# Patient Record
Sex: Female | Born: 1970 | Race: Black or African American | Hispanic: No | Marital: Married | Smoking: Never smoker
Health system: Southern US, Community
[De-identification: ages and names within clinical notes are randomized; demographics above are authoritative.]

## PROBLEM LIST (undated history)

## (undated) DIAGNOSIS — E785 Hyperlipidemia, unspecified: Secondary | ICD-10-CM

## (undated) HISTORY — DX: Hyperlipidemia, unspecified: E78.5

## (undated) HISTORY — PX: BACK SURGERY: SHX140

## (undated) HISTORY — PX: AUGMENTATION MAMMAPLASTY: SUR837

---

## 2014-05-06 ENCOUNTER — Other Ambulatory Visit (HOSPITAL_COMMUNITY)
Admission: RE | Admit: 2014-05-06 | Discharge: 2014-05-06 | Disposition: A | Discharge status: Home / Self Care | Payer: Managed Care, Other (non HMO) | Source: Ambulatory Visit | Attending: Obstetrics and Gynecology | Admitting: Obstetrics and Gynecology

## 2014-05-06 DIAGNOSIS — Z01419 Encounter for gynecological examination (general) (routine) without abnormal findings: Secondary | ICD-10-CM | POA: Insufficient documentation

## 2014-05-06 DIAGNOSIS — Z1151 Encounter for screening for human papillomavirus (HPV): Secondary | ICD-10-CM | POA: Diagnosis present

## 2014-05-29 ENCOUNTER — Other Ambulatory Visit: Payer: Self-pay

## 2014-05-29 DIAGNOSIS — Z1231 Encounter for screening mammogram for malignant neoplasm of breast: Secondary | ICD-10-CM

## 2014-06-12 ENCOUNTER — Ambulatory Visit
Admission: RE | Admit: 2014-06-12 | Discharge: 2014-06-12 | Disposition: A | Discharge status: Home / Self Care | Payer: Commercial Indemnity | Source: Ambulatory Visit

## 2014-06-12 DIAGNOSIS — Z1231 Encounter for screening mammogram for malignant neoplasm of breast: Secondary | ICD-10-CM

## 2014-08-19 DIAGNOSIS — K9041 Non-celiac gluten sensitivity: Secondary | ICD-10-CM | POA: Insufficient documentation

## 2015-07-02 ENCOUNTER — Ambulatory Visit: Payer: Commercial Indemnity | Admitting: Psychology

## 2015-07-26 ENCOUNTER — Encounter: Payer: Self-pay | Admitting: Obstetrics & Gynecology

## 2015-07-26 ENCOUNTER — Ambulatory Visit (INDEPENDENT_AMBULATORY_CARE_PROVIDER_SITE_OTHER): Payer: Managed Care, Other (non HMO) | Admitting: Obstetrics & Gynecology

## 2015-07-26 VITALS — BP 107/73 | HR 73 | Ht 64.0 in | Wt 150.0 lb

## 2015-07-26 DIAGNOSIS — Z3041 Encounter for surveillance of contraceptive pills: Secondary | ICD-10-CM

## 2015-07-26 DIAGNOSIS — Z113 Encounter for screening for infections with a predominantly sexual mode of transmission: Secondary | ICD-10-CM

## 2015-07-26 DIAGNOSIS — S31809A Unspecified open wound of unspecified buttock, initial encounter: Secondary | ICD-10-CM | POA: Diagnosis not present

## 2015-07-26 DIAGNOSIS — Z1151 Encounter for screening for human papillomavirus (HPV): Secondary | ICD-10-CM | POA: Diagnosis not present

## 2015-07-26 DIAGNOSIS — Z1231 Encounter for screening mammogram for malignant neoplasm of breast: Secondary | ICD-10-CM

## 2015-07-26 DIAGNOSIS — Z124 Encounter for screening for malignant neoplasm of cervix: Secondary | ICD-10-CM

## 2015-07-26 DIAGNOSIS — N946 Dysmenorrhea, unspecified: Secondary | ICD-10-CM | POA: Diagnosis not present

## 2015-07-26 DIAGNOSIS — Z01419 Encounter for gynecological examination (general) (routine) without abnormal findings: Secondary | ICD-10-CM

## 2015-07-26 MED ORDER — TRIAMCINOLONE ACETONIDE 0.5 % EX OINT: 1.0000 "application " | TOPICAL_OINTMENT | Freq: Two times a day (BID) | CUTANEOUS | Status: DC

## 2015-07-26 MED ORDER — NORGESTIMATE-ETH ESTRADIOL 0.25-35 MG-MCG PO TABS: 1.0000 | ORAL_TABLET | Freq: Every day | ORAL | Status: DC

## 2015-07-26 MED ORDER — NAPROXEN 500 MG PO TABS: 500.0000 mg | ORAL_TABLET | Freq: Two times a day (BID) | ORAL | Status: DC

## 2015-07-26 NOTE — Progress Notes (Signed)
GYNECOLOGY CLINIC ANNUAL PREVENTATIVE CARE ENCOUNTER NOTE  Subjective:   Regina Schaefer is a 45 y.o. [phone] female here for a routine annual gynecologic exam and to establish care.  Current complaints: patient has dysmenorrhea for first two days of period despite being on OCPs, wants to know what she can take for that.  Also reports having occasional skin tear in gluteal cleft, was prescribed topical medication in past that helps with this but she is unsure of its name.   Denies abnormal vaginal bleeding, discharge, other pelvic pain, problems with intercourse or other gynecologic concerns.    Gynecologic History Patient's last menstrual period was 07/19/2015. Contraception: OCP (estrogen/progesterone) - Sprintec; desires refill Last Pap: 2016. Results were: normal Last mammogram: 2016. Results were: normal  Obstetric History OB History  Gravida Para Term Preterm AB SAB TAB Ectopic Multiple Living  3 2   1 1    2     # Outcome Date GA Lbr Len/2nd Weight Sex Delivery Anes PTL Lv  3 SAB           2 Para      Vag-Spont     1 Para      CS-Unspec         Past Medical History  Diagnosis Date  . Hyperlipidemia     Past Surgical History  Procedure Laterality Date  . Cesarean section    . Back surgery      No current outpatient prescriptions on file prior to visit.   No current facility-administered medications on file prior to visit.    Allergies  Allergen Reactions  . Gluten Meal   . Orphenadrine     Other reaction(s): Confusion  . Shellfish Allergy Nausea Only    Social History   Social History  . Marital Status: Married    Spouse Name: N/A  . Number of Children: N/A  . Years of Education: N/A   Occupational History  . Not on file.   Social History Main Topics  . Smoking status: Never Smoker   . Smokeless tobacco: Not on file  . Alcohol Use: No  . Drug Use: No  . Sexual Activity: Yes    Birth Control/ Protection: Pill, Surgical   Other Topics Concern   . Not on file   Social History Narrative  . No narrative on file    Family History  Problem Relation Age of Onset  . Cancer Father     leukemia  . Diabetes Father   . Hypertension Father   . Stroke Neg Hx     The following portions of the patient's history were reviewed and updated as appropriate: allergies, current medications, past family history, past medical history, past social history, past surgical history and problem list.  Review of Systems Pertinent items noted in HPI and remainder of comprehensive ROS otherwise negative.   Objective:  BP 107/73 mmHg  Pulse 73  Ht 5\' 4"  (1.626 m)  Wt 150 lb (68.04 kg)  BMI 25.73 kg/m2  LMP 07/19/2015 CONSTITUTIONAL: Well-developed, well-nourished female in no acute distress.  HENT:  Normocephalic, atraumatic, External right and left ear normal. Oropharynx is clear and moist EYES: Conjunctivae and EOM are normal. Pupils are equal, round, and reactive to light. No scleral icterus.  NECK: Normal range of motion, supple, no masses.  Normal thyroid.  SKIN: Skin is warm and dry. No rash noted. Not diaphoretic. No erythema. No pallor. NEUROLOGIC: Alert and oriented to person, place, and time. Normal reflexes, muscle tone coordination.  No cranial nerve deficit noted. PSYCHIATRIC: Normal mood and affect. Normal behavior. Normal judgment and thought content. CARDIOVASCULAR: Normal heart rate noted, regular rhythm RESPIRATORY: Clear to auscultation bilaterally. Effort and breath sounds normal, no problems with respiration noted. BREASTS: Symmetric in size. No masses, skin changes, nipple drainage, or lymphadenopathy. ABDOMEN: Soft, normal bowel sounds, no distention noted.  No tenderness, rebound or guarding.  PELVIC: Normal appearing external genitalia; normal appearing vaginal mucosa and cervix.  Small amount of bloody discharge noted.  Pap smear obtained.  Normal uterine size, no other palpable masses, no uterine or adnexal tenderness. No  gluteal cleft skin tear appreciated today MUSCULOSKELETAL: Normal range of motion. No tenderness.  No cyanosis, clubbing, or edema.  2+ distal pulses.   Assessment and Plan  1. Encounter for gynecological examination with Papanicolaou smear of cervix - Cytology - PAP done with ancillary testing as per patient request. Will follow up results and manage accordingly.   2. Uses oral contraception Refills ordered for patient. - norgestimate-ethinyl estradiol (ORTHO-CYCLEN,SPRINTEC,PREVIFEM) 0.25-35 MG-MCG tablet; Take 1 tablet by mouth daily.  Dispense: 3 Package; Refill: 5  3. Dysmenorrhea - naproxen (NAPROSYN) 500 MG tablet; Take 1 tablet (500 mg total) by mouth 2 (two) times daily with a meal. As needed for pain  Dispense: 60 tablet; Refill: 2 If pain persists, may consider taking OCPs in a continuous fashion.  4. Visit for screening mammogram - MM DIGITAL SCREENING BILATERAL; Future ordered  5. Gluteal cleft wound, unspecified laterality, initial encounter She likely was prescribed topical steroid ointment to use as needed.  She was told to call back if this does not help. - triamcinolone ointment (KENALOG) 0.5 %; Apply 1 application topically 2 (two) times daily.  Dispense: 30 g; Refill: 0  Routine preventative health maintenance measures emphasized. Please refer to After Visit Summary for other counseling recommendations.    Jaynie Collins, MD, FACOG Attending Obstetrician & Gynecologist, [facility]

## 2015-07-26 NOTE — Patient Instructions (Signed)
Thank you for enrolling in [facility]. Please follow the instructions below to securely access your online medical record. MyChart allows you to send messages to your doctor, view your test results, manage appointments, and more.   How Do I Sign Up? 1. In your Internet browser, go to [employer] and enter [email or web address]. 2. Click on the Sign Up Now link in the Sign In box. You will see the New Member Sign Up page. 3. Enter your MyChart Access Code exactly as it appears below. You will not need to use this code after you've completed the sign-up process. If you do not sign up before the expiration date, you must request a new code.  MyChart Access Code: Activation code not generated Current MyChart Status: Active  4. Enter your Social Security Number ([id number]) and Date of Birth (mm/dd/yyyy) as indicated and click Submit. You will be taken to the next sign-up page. 5. Create a MyChart ID. This will be your MyChart login ID and cannot be changed, so think of one that is secure and easy to remember. 6. Create a MyChart password. You can change your password at any time. 7. Enter your Password Reset Question and Answer. This can be used at a later time if you forget your password.  8. Enter your e-mail address. You will receive e-mail notification when new information is available in [facility]. 9. Click Sign Up. You can now view your medical record.   Additional Information Remember, MyChart is NOT to be used for urgent needs. For medical emergencies, dial 911.   Preventive Care for Adults, Female A healthy lifestyle and preventive care can promote health and wellness. Preventive health guidelines for women include the following key practices.  A routine yearly physical is a good way to check with your health care provider about your health and preventive screening. It is a chance to share any concerns and updates on your health and to receive a thorough exam.  Visit your  dentist for a routine exam and preventive care every 6 months. Brush your teeth twice a day and floss once a day. Good oral hygiene prevents tooth decay and gum disease.  The frequency of eye exams is based on your age, health, family medical history, use of contact lenses, and other factors. Follow your health care provider's recommendations for frequency of eye exams.  Eat a healthy diet. Foods like vegetables, fruits, whole grains, low-fat dairy products, and lean protein foods contain the nutrients you need without too many calories. Decrease your intake of foods high in solid fats, added sugars, and salt. Eat the right amount of calories for you.Get information about a proper diet from your health care provider, if necessary.  Regular physical exercise is one of the most important things you can do for your health. Most adults should get at least 150 minutes of moderate-intensity exercise (any activity that increases your heart rate and causes you to sweat) each week. In addition, most adults need muscle-strengthening exercises on 2 or more days a week.  Maintain a healthy weight. The body mass index (BMI) is a screening tool to identify possible weight problems. It provides an estimate of body fat based on height and weight. Your health care provider can find your BMI and can help you achieve or maintain a healthy weight.For adults 20 years and older:  A BMI below 18.5 is considered underweight.  A BMI of 18.5 to 24.9 is normal.  A BMI of 25 to 29.9 is considered overweight.  A BMI of 30 and above is considered obese.  Maintain normal blood lipids and cholesterol levels by exercising and minimizing your intake of saturated fat. Eat a balanced diet with plenty of fruit and vegetables. Blood tests for lipids and cholesterol should begin at age 3 and be repeated every 5 years. If your lipid or cholesterol levels are high, you are over 50, or you are at high risk for heart disease, you may  need your cholesterol levels checked more frequently.Ongoing high lipid and cholesterol levels should be treated with medicines if diet and exercise are not working.  If you smoke, find out from your health care provider how to quit. If you do not use tobacco, do not start.  Lung cancer screening is recommended for adults aged 14-80 years who are at high risk for developing lung cancer because of a history of smoking. A yearly low-dose CT scan of the lungs is recommended for people who have at least a 30-pack-year history of smoking and are a current smoker or have quit within the past 15 years. A pack year of smoking is smoking an average of 1 pack of cigarettes a day for 1 year (for example: 1 pack a day for 30 years or 2 packs a day for 15 years). Yearly screening should continue until the smoker has stopped smoking for at least 15 years. Yearly screening should be stopped for people who develop a health problem that would prevent them from having lung cancer treatment.  If you are pregnant, do not drink alcohol. If you are breastfeeding, be very cautious about drinking alcohol. If you are not pregnant and choose to drink alcohol, do not have more than 1 drink per day. One drink is considered to be 12 ounces (355 mL) of beer, 5 ounces (148 mL) of wine, or 1.5 ounces (44 mL) of liquor.  Avoid use of street drugs. Do not share needles with anyone. Ask for help if you need support or instructions about stopping the use of drugs.  High blood pressure causes heart disease and increases the risk of stroke. Your blood pressure should be checked at least every 1 to 2 years. Ongoing high blood pressure should be treated with medicines if weight loss and exercise do not work.  If you are 63-71 years old, ask your health care provider if you should take aspirin to prevent strokes.  Diabetes screening is done by taking a blood sample to check your blood glucose level after you have not eaten for a certain  period of time (fasting). If you are not overweight and you do not have risk factors for diabetes, you should be screened once every 3 years starting at age 54. If you are overweight or obese and you are 48-74 years of age, you should be screened for diabetes every year as part of your cardiovascular risk assessment.  Breast cancer screening is essential preventive care for women. You should practice "breast self-awareness." This means understanding the normal appearance and feel of your breasts and may include breast self-examination. Any changes detected, no matter how small, should be reported to a health care provider. Women in their 69s and 30s should have a clinical breast exam (CBE) by a health care provider as part of a regular health exam every 1 to 3 years. After age 40, women should have a CBE every year. Starting at age 75, women should consider having a mammogram (breast X-ray test) every year. Women who have a family history of  breast cancer should talk to their health care provider about genetic screening. Women at a high risk of breast cancer should talk to their health care providers about having an MRI and a mammogram every year.  Breast cancer gene (BRCA)-related cancer risk assessment is recommended for women who have family members with BRCA-related cancers. BRCA-related cancers include breast, ovarian, tubal, and peritoneal cancers. Having family members with these cancers may be associated with an increased risk for harmful changes (mutations) in the breast cancer genes BRCA1 and BRCA2. Results of the assessment will determine the need for genetic counseling and BRCA1 and BRCA2 testing.  Your health care provider may recommend that you be screened regularly for cancer of the pelvic organs (ovaries, uterus, and vagina). This screening involves a pelvic examination, including checking for microscopic changes to the surface of your cervix (Pap test). You may be encouraged to have this  screening done every 3 years, beginning at age 31.  For women ages 3-65, health care providers may recommend pelvic exams and Pap testing every 3 years, or they may recommend the Pap and pelvic exam, combined with testing for human papilloma virus (HPV), every 5 years. Some types of HPV increase your risk of cervical cancer. Testing for HPV may also be done on women of any age with unclear Pap test results.  Other health care providers may not recommend any screening for nonpregnant women who are considered low risk for pelvic cancer and who do not have symptoms. Ask your health care provider if a screening pelvic exam is right for you.  If you have had past treatment for cervical cancer or a condition that could lead to cancer, you need Pap tests and screening for cancer for at least 20 years after your treatment. If Pap tests have been discontinued, your risk factors (such as having a new sexual partner) need to be reassessed to determine if screening should resume. Some women have medical problems that increase the chance of getting cervical cancer. In these cases, your health care provider may recommend more frequent screening and Pap tests.  Colorectal cancer can be detected and often prevented. Most routine colorectal cancer screening begins at the age of 13 years and continues through age 42 years. However, your health care provider may recommend screening at an earlier age if you have risk factors for colon cancer. On a yearly basis, your health care provider may provide home test kits to check for hidden blood in the stool. Use of a small camera at the end of a tube, to directly examine the colon (sigmoidoscopy or colonoscopy), can detect the earliest forms of colorectal cancer. Talk to your health care provider about this at age 89, when routine screening begins. Direct exam of the colon should be repeated every 5-10 years through age 44 years, unless early forms of precancerous polyps or small  growths are found.  People who are at an increased risk for hepatitis B should be screened for this virus. You are considered at high risk for hepatitis B if:  You were born in a country where hepatitis B occurs often. Talk with your health care provider about which countries are considered high risk.  Your parents were born in a high-risk country and you have not received a shot to protect against hepatitis B (hepatitis B vaccine).  You have HIV or AIDS.  You use needles to inject street drugs.  You live with, or have sex with, someone who has hepatitis B.  You get  hemodialysis treatment.  You take certain medicines for conditions like cancer, organ transplantation, and autoimmune conditions.  Hepatitis C blood testing is recommended for all people born from [birth year] through 1965 and any individual with known risks for hepatitis C.  Practice safe sex. Use condoms and avoid high-risk sexual practices to reduce the spread of sexually transmitted infections (STIs). STIs include gonorrhea, chlamydia, syphilis, trichomonas, herpes, HPV, and human immunodeficiency virus (HIV). Herpes, HIV, and HPV are viral illnesses that have no cure. They can result in disability, cancer, and death.  You should be screened for sexually transmitted illnesses (STIs) including gonorrhea and chlamydia if:  You are sexually active and are younger than 24 years.  You are older than 24 years and your health care provider tells you that you are at risk for this type of infection.  Your sexual activity has changed since you were last screened and you are at an increased risk for chlamydia or gonorrhea. Ask your health care provider if you are at risk.  If you are at risk of being infected with HIV, it is recommended that you take a prescription medicine daily to prevent HIV infection. This is called preexposure prophylaxis (PrEP). You are considered at risk if:  You are sexually active and do not regularly use  condoms or know the HIV status of your partner(s).  You take drugs by injection.  You are sexually active with a partner who has HIV.  Talk with your health care provider about whether you are at high risk of being infected with HIV. If you choose to begin PrEP, you should first be tested for HIV. You should then be tested every 3 months for as long as you are taking PrEP.  Osteoporosis is a disease in which the bones lose minerals and strength with aging. This can result in serious bone fractures or breaks. The risk of osteoporosis can be identified using a bone density scan. Women ages 100 years and over and women at risk for fractures or osteoporosis should discuss screening with their health care providers. Ask your health care provider whether you should take a calcium supplement or vitamin D to reduce the rate of osteoporosis.  Menopause can be associated with physical symptoms and risks. Hormone replacement therapy is available to decrease symptoms and risks. You should talk to your health care provider about whether hormone replacement therapy is right for you.  Use sunscreen. Apply sunscreen liberally and repeatedly throughout the day. You should seek shade when your shadow is shorter than you. Protect yourself by wearing long sleeves, pants, a wide-brimmed hat, and sunglasses year round, whenever you are outdoors.  Once a month, do a whole body skin exam, using a mirror to look at the skin on your back. Tell your health care provider of new moles, moles that have irregular borders, moles that are larger than a pencil eraser, or moles that have changed in shape or color.  Stay current with required vaccines (immunizations).  Influenza vaccine. All adults should be immunized every year.  Tetanus, diphtheria, and acellular pertussis (Td, Tdap) vaccine. Pregnant women should receive 1 dose of Tdap vaccine during each pregnancy. The dose should be obtained regardless of the length of time  since the last dose. Immunization is preferred during the 27th-36th week of gestation. An adult who has not previously received Tdap or who does not know her vaccine status should receive 1 dose of Tdap. This initial dose should be followed by tetanus and diphtheria toxoids (Td)  booster doses every 10 years. Adults with an unknown or incomplete history of completing a 3-dose immunization series with Td-containing vaccines should begin or complete a primary immunization series including a Tdap dose. Adults should receive a Td booster every 10 years.  Varicella vaccine. An adult without evidence of immunity to varicella should receive 2 doses or a second dose if she has previously received 1 dose. Pregnant females who do not have evidence of immunity should receive the first dose after pregnancy. This first dose should be obtained before leaving the health care facility. The second dose should be obtained 4-8 weeks after the first dose.  Human papillomavirus (HPV) vaccine. Females aged 13-26 years who have not received the vaccine previously should obtain the 3-dose series. The vaccine is not recommended for use in pregnant females. However, pregnancy testing is not needed before receiving a dose. If a female is found to be pregnant after receiving a dose, no treatment is needed. In that case, the remaining doses should be delayed until after the pregnancy. Immunization is recommended for any person with an immunocompromised condition through the age of 100 years if she did not get any or all doses earlier. During the 3-dose series, the second dose should be obtained 4-8 weeks after the first dose. The third dose should be obtained 24 weeks after the first dose and 16 weeks after the second dose.  Zoster vaccine. One dose is recommended for adults aged 9 years or older unless certain conditions are present.  Measles, mumps, and rubella (MMR) vaccine. Adults born before 60 generally are considered immune to  measles and mumps. Adults born in [birth year] or later should have 1 or more doses of MMR vaccine unless there is a contraindication to the vaccine or there is laboratory evidence of immunity to each of the three diseases. A routine second dose of MMR vaccine should be obtained at least 28 days after the first dose for students attending postsecondary schools, health care workers, or international travelers. People who received inactivated measles vaccine or an unknown type of measles vaccine during 1963-1967 should receive 2 doses of MMR vaccine. People who received inactivated mumps vaccine or an unknown type of mumps vaccine before 1979 and are at high risk for mumps infection should consider immunization with 2 doses of MMR vaccine. For females of childbearing age, rubella immunity should be determined. If there is no evidence of immunity, females who are not pregnant should be vaccinated. If there is no evidence of immunity, females who are pregnant should delay immunization until after pregnancy. Unvaccinated health care workers born before [birth year] who lack laboratory evidence of measles, mumps, or rubella immunity or laboratory confirmation of disease should consider measles and mumps immunization with 2 doses of MMR vaccine or rubella immunization with 1 dose of MMR vaccine.  Pneumococcal 13-valent conjugate (PCV13) vaccine. When indicated, a person who is uncertain of his immunization history and has no record of immunization should receive the PCV13 vaccine. All adults 8 years of age and older should receive this vaccine. An adult aged 42 years or older who has certain medical conditions and has not been previously immunized should receive 1 dose of PCV13 vaccine. This PCV13 should be followed with a dose of pneumococcal polysaccharide (PPSV23) vaccine. Adults who are at high risk for pneumococcal disease should obtain the PPSV23 vaccine at least 8 weeks after the dose of PCV13 vaccine. Adults older than 45 years  of age who have normal immune system function should obtain  the PPSV23 vaccine dose at least 1 year after the dose of PCV13 vaccine.  Pneumococcal polysaccharide (PPSV23) vaccine. When PCV13 is also indicated, PCV13 should be obtained first. All adults aged 18 years and older should be immunized. An adult younger than age 26 years who has certain medical conditions should be immunized. Any person who resides in a nursing home or long-term care facility should be immunized. An adult smoker should be immunized. People with an immunocompromised condition and certain other conditions should receive both PCV13 and PPSV23 vaccines. People with human immunodeficiency virus (HIV) infection should be immunized as soon as possible after diagnosis. Immunization during chemotherapy or radiation therapy should be avoided. Routine use of PPSV23 vaccine is not recommended for American Indians, [place] Natives, or people younger than 65 years unless there are medical conditions that require PPSV23 vaccine. When indicated, people who have unknown immunization and have no record of immunization should receive PPSV23 vaccine. One-time revaccination 5 years after the first dose of PPSV23 is recommended for people aged 19-64 years who have chronic kidney failure, nephrotic syndrome, asplenia, or immunocompromised conditions. People who received 1-2 doses of PPSV23 before age 11 years should receive another dose of PPSV23 vaccine at age 80 years or later if at least 5 years have passed since the previous dose. Doses of PPSV23 are not needed for people immunized with PPSV23 at or after age 75 years.  Meningococcal vaccine. Adults with asplenia or persistent complement component deficiencies should receive 2 doses of quadrivalent meningococcal conjugate (MenACWY-D) vaccine. The doses should be obtained at least 2 months apart. Microbiologists working with certain meningococcal bacteria, [employer] recruits, people at risk during an  outbreak, and people who travel to or live in countries with a high rate of meningitis should be immunized. A first-year college student up through age 42 years who is living in a residence hall should receive a dose if she did not receive a dose on or after her 16th birthday. Adults who have certain high-risk conditions should receive one or more doses of vaccine.  Hepatitis A vaccine. Adults who wish to be protected from this disease, have certain high-risk conditions, work with hepatitis A-infected animals, work in hepatitis A research labs, or travel to or work in countries with a high rate of hepatitis A should be immunized. Adults who were previously unvaccinated and who anticipate close contact with an international adoptee during the first 60 days after arrival in the [place] from a country with a high rate of hepatitis A should be immunized.  Hepatitis B vaccine. Adults who wish to be protected from this disease, have certain high-risk conditions, may be exposed to blood or other infectious body fluids, are household contacts or sex partners of hepatitis B positive people, are clients or workers in certain care facilities, or travel to or work in countries with a high rate of hepatitis B should be immunized.  Haemophilus influenzae type b (Hib) vaccine. A previously unvaccinated person with asplenia or sickle cell disease or having a scheduled splenectomy should receive 1 dose of Hib vaccine. Regardless of previous immunization, a recipient of a hematopoietic stem cell transplant should receive a 3-dose series 6-12 months after her successful transplant. Hib vaccine is not recommended for adults with HIV infection. Preventive Services / Frequency Ages 65 to 36 years  Blood pressure check.** / Every 3-5 years.  Lipid and cholesterol check.** / Every 5 years beginning at age 69.  Clinical breast exam.** / Every 3 years for women  in their 51s and 30s.  BRCA-related cancer risk  assessment.** / For women who have family members with a BRCA-related cancer (breast, ovarian, tubal, or peritoneal cancers).  Pap test.** / Every 2 years from ages 75 through 110. Every 3 years starting at age 36 through age 56 or 22 with a history of 3 consecutive normal Pap tests.  HPV screening.** / Every 3 years from ages 85 through ages 14 to 55 with a history of 3 consecutive normal Pap tests.  Hepatitis C blood test.** / For any individual with known risks for hepatitis C.  Skin self-exam. / Monthly.  Influenza vaccine. / Every year.  Tetanus, diphtheria, and acellular pertussis (Tdap, Td) vaccine.** / Consult your health care provider. Pregnant women should receive 1 dose of Tdap vaccine during each pregnancy. 1 dose of Td every 10 years.  Varicella vaccine.** / Consult your health care provider. Pregnant females who do not have evidence of immunity should receive the first dose after pregnancy.  HPV vaccine. / 3 doses over 6 months, if 60 and younger. The vaccine is not recommended for use in pregnant females. However, pregnancy testing is not needed before receiving a dose.  Measles, mumps, rubella (MMR) vaccine.** / You need at least 1 dose of MMR if you were born in 1957 or later. You may also need a 2nd dose. For females of childbearing age, rubella immunity should be determined. If there is no evidence of immunity, females who are not pregnant should be vaccinated. If there is no evidence of immunity, females who are pregnant should delay immunization until after pregnancy.  Pneumococcal 13-valent conjugate (PCV13) vaccine.** / Consult your health care provider.  Pneumococcal polysaccharide (PPSV23) vaccine.** / 1 to 2 doses if you smoke cigarettes or if you have certain conditions.  Meningococcal vaccine.** / 1 dose if you are age 78 to 15 years and a Market researcher living in a residence hall, or have one of several medical conditions, you need to get vaccinated  against meningococcal disease. You may also need additional booster doses.  Hepatitis A vaccine.** / Consult your health care provider.  Hepatitis B vaccine.** / Consult your health care provider.  Haemophilus influenzae type b (Hib) vaccine.** / Consult your health care provider. Ages 74 to 41 years  Blood pressure check.** / Every year.  Lipid and cholesterol check.** / Every 5 years beginning at age 47 years.  Lung cancer screening. / Every year if you are aged 16-80 years and have a 30-pack-year history of smoking and currently smoke or have quit within the past 15 years. Yearly screening is stopped once you have quit smoking for at least 15 years or develop a health problem that would prevent you from having lung cancer treatment.  Clinical breast exam.** / Every year after age 11 years.  BRCA-related cancer risk assessment.** / For women who have family members with a BRCA-related cancer (breast, ovarian, tubal, or peritoneal cancers).  Mammogram.** / Every year beginning at age 78 years and continuing for as long as you are in good health. Consult with your health care provider.  Pap test.** / Every 3 years starting at age 55 years through age 30 or 22 years with a history of 3 consecutive normal Pap tests.  HPV screening.** / Every 3 years from ages 58 years through ages 63 to 47 years with a history of 3 consecutive normal Pap tests.  Fecal occult blood test (FOBT) of stool. / Every year beginning at age 73  years and continuing until age 62 years. You may not need to do this test if you get a colonoscopy every 10 years.  Flexible sigmoidoscopy or colonoscopy.** / Every 5 years for a flexible sigmoidoscopy or every 10 years for a colonoscopy beginning at age 8 years and continuing until age 27 years.  Hepatitis C blood test.** / For all people born from [birth year] through 1965 and any individual with known risks for hepatitis C.  Skin self-exam. / Monthly.  Influenza vaccine. /  Every year.  Tetanus, diphtheria, and acellular pertussis (Tdap/Td) vaccine.** / Consult your health care provider. Pregnant women should receive 1 dose of Tdap vaccine during each pregnancy. 1 dose of Td every 10 years.  Varicella vaccine.** / Consult your health care provider. Pregnant females who do not have evidence of immunity should receive the first dose after pregnancy.  Zoster vaccine.** / 1 dose for adults aged 40 years or older.  Measles, mumps, rubella (MMR) vaccine.** / You need at least 1 dose of MMR if you were born in 1957 or later. You may also need a second dose. For females of childbearing age, rubella immunity should be determined. If there is no evidence of immunity, females who are not pregnant should be vaccinated. If there is no evidence of immunity, females who are pregnant should delay immunization until after pregnancy.  Pneumococcal 13-valent conjugate (PCV13) vaccine.** / Consult your health care provider.  Pneumococcal polysaccharide (PPSV23) vaccine.** / 1 to 2 doses if you smoke cigarettes or if you have certain conditions.  Meningococcal vaccine.** / Consult your health care provider.  Hepatitis A vaccine.** / Consult your health care provider.  Hepatitis B vaccine.** / Consult your health care provider.  Haemophilus influenzae type b (Hib) vaccine.** / Consult your health care provider. Ages 73 years and over  Blood pressure check.** / Every year.  Lipid and cholesterol check.** / Every 5 years beginning at age 76 years.  Lung cancer screening. / Every year if you are aged 71-80 years and have a 30-pack-year history of smoking and currently smoke or have quit within the past 15 years. Yearly screening is stopped once you have quit smoking for at least 15 years or develop a health problem that would prevent you from having lung cancer treatment.  Clinical breast exam.** / Every year after age 69 years.  BRCA-related cancer risk assessment.** / For  women who have family members with a BRCA-related cancer (breast, ovarian, tubal, or peritoneal cancers).  Mammogram.** / Every year beginning at age 59 years and continuing for as long as you are in good health. Consult with your health care provider.  Pap test.** / Every 3 years starting at age 57 years through age 29 or 78 years with 3 consecutive normal Pap tests. Testing can be stopped between 65 and 70 years with 3 consecutive normal Pap tests and no abnormal Pap or HPV tests in the past 10 years.  HPV screening.** / Every 3 years from ages 74 years through ages 64 or 29 years with a history of 3 consecutive normal Pap tests. Testing can be stopped between 65 and 70 years with 3 consecutive normal Pap tests and no abnormal Pap or HPV tests in the past 10 years.  Fecal occult blood test (FOBT) of stool. / Every year beginning at age 76 years and continuing until age 64 years. You may not need to do this test if you get a colonoscopy every 10 years.  Flexible sigmoidoscopy or colonoscopy.** /  Every 5 years for a flexible sigmoidoscopy or every 10 years for a colonoscopy beginning at age 62 years and continuing until age 16 years.  Hepatitis C blood test.** / For all people born from [birth year] through 1965 and any individual with known risks for hepatitis C.  Osteoporosis screening.** / A one-time screening for women ages 84 years and over and women at risk for fractures or osteoporosis.  Skin self-exam. / Monthly.  Influenza vaccine. / Every year.  Tetanus, diphtheria, and acellular pertussis (Tdap/Td) vaccine.** / 1 dose of Td every 10 years.  Varicella vaccine.** / Consult your health care provider.  Zoster vaccine.** / 1 dose for adults aged 62 years or older.  Pneumococcal 13-valent conjugate (PCV13) vaccine.** / Consult your health care provider.  Pneumococcal polysaccharide (PPSV23) vaccine.** / 1 dose for all adults aged 30 years and older.  Meningococcal vaccine.** / Consult your  health care provider.  Hepatitis A vaccine.** / Consult your health care provider.  Hepatitis B vaccine.** / Consult your health care provider.  Haemophilus influenzae type b (Hib) vaccine.** / Consult your health care provider. ** Family history and personal history of risk and conditions may change your health care provider's recommendations.   This information is not intended to replace advice given to you by your health care provider. Make sure you discuss any questions you have with your health care provider.   Document Released: 03/21/2001 Document Revised: 02/13/2014 Document Reviewed: 06/20/2010 Elsevier Interactive Patient Education [employer].

## 2015-07-27 ENCOUNTER — Telehealth: Payer: Self-pay

## 2015-07-27 ENCOUNTER — Other Ambulatory Visit: Payer: Self-pay | Admitting: Obstetrics & Gynecology

## 2015-07-27 LAB — CYTOLOGY - PAP

## 2015-07-27 NOTE — Telephone Encounter (Signed)
Pt called [facility] office stating [facility] never received medication request. Called [facility] and pharmacy stated due to power outage their system was offline. I reordered the medicine for the patient and pt is aware it was called in.

## 2015-07-28 ENCOUNTER — Telehealth: Payer: Self-pay

## 2015-07-28 NOTE — Telephone Encounter (Signed)
Patient called and states that [facility] doesn't have her prescriptions. Called pharmacy and tech states that there is a note that patient talked with pharmacy supervisor and Rxs were closed out.   Called patient to inform her of this and she denies called to reject Rxs. Patient requests

## 2015-07-28 NOTE — Telephone Encounter (Signed)
[facility] Called all three scripts in (Orthotricyclen, naprosyen, and triamcilone cream). When calling pharmacy back they said that a provider closed the scripts out yesterday because they had them listed under a Dr. Richardson Doppole. Informed pharmacy that we dont have a Dr. Richardson Doppole in our group and gave her prescriber Dr. Macon LargeAnyanwu and the prescribers NPI number. Made pharmacy aware that patient would be in to pick up scripts in one hour. Armandina StammerJennifer Kaydon Husby RNBSN

## 2015-08-23 ENCOUNTER — Encounter: Payer: Self-pay | Admitting: Obstetrics & Gynecology

## 2015-08-23 ENCOUNTER — Ambulatory Visit (INDEPENDENT_AMBULATORY_CARE_PROVIDER_SITE_OTHER): Payer: Managed Care, Other (non HMO) | Admitting: Obstetrics & Gynecology

## 2015-08-23 ENCOUNTER — Other Ambulatory Visit: Payer: Self-pay | Admitting: *Deleted

## 2015-08-23 VITALS — BP 110/68 | HR 64 | Ht 64.0 in | Wt 154.0 lb

## 2015-08-23 DIAGNOSIS — B3731 Acute candidiasis of vulva and vagina: Secondary | ICD-10-CM

## 2015-08-23 DIAGNOSIS — B373 Candidiasis of vulva and vagina: Secondary | ICD-10-CM | POA: Diagnosis not present

## 2015-08-23 MED ORDER — FLUCONAZOLE 150 MG PO TABS: 150.0000 mg | ORAL_TABLET | Freq: Once | ORAL | Status: DC

## 2015-08-23 NOTE — Telephone Encounter (Signed)
Pt called stating that [facility] [address].  This RX was then sent to local [facility]

## 2015-08-23 NOTE — Addendum Note (Signed)
Addended by: Anell BarrHOWARD, JENNIFER L on: 08/23/2015 09:11 AM   Modules accepted: Orders

## 2015-08-23 NOTE — Patient Instructions (Addendum)
Vaginitis °Vaginitis is an inflammation of the vagina. It is most often caused by a change in the normal balance of the bacteria and yeast that live in the vagina. This change in balance causes an overgrowth of certain bacteria or yeast, which causes the inflammation. There are different types of vaginitis, but the most common types are: °· Bacterial vaginosis. °· Yeast infection (candidiasis). °· Trichomoniasis vaginitis. This is a sexually transmitted infection (STI). °· Viral vaginitis. °· Atrophic vaginitis. °· Allergic vaginitis. °CAUSES  °The cause depends on the type of vaginitis. Vaginitis can be caused by: °· Bacteria (bacterial vaginosis). °· Yeast (yeast infection). °· A parasite (trichomoniasis vaginitis) °· A virus (viral vaginitis). °· Low hormone levels (atrophic vaginitis). Low hormone levels can occur during pregnancy, breastfeeding, or after menopause. °· Irritants, such as bubble baths, scented tampons, and feminine sprays (allergic vaginitis). °Other factors can change the normal balance of the yeast and bacteria that live in the vagina. These include: °· Antibiotic medicines. °· Poor hygiene. °· Diaphragms, vaginal sponges, spermicides, birth control pills, and intrauterine devices (IUD). °· Sexual intercourse. °· Infection. °· Uncontrolled diabetes. °· A weakened immune system. °SYMPTOMS  °Symptoms can vary depending on the cause of the vaginitis. Common symptoms include: °· Abnormal vaginal discharge. °· The discharge is white, gray, or yellow with bacterial vaginosis. °· The discharge is thick, white, and cheesy with a yeast infection. °· The discharge is frothy and yellow or greenish with trichomoniasis. °· A bad vaginal odor. °· The odor is fishy with bacterial vaginosis. °· Vaginal itching, pain, or swelling. °· Painful intercourse. °· Pain or burning when urinating. °Sometimes, there are no symptoms. °TREATMENT  °Treatment will vary depending on the type of infection.  °· Bacterial  vaginosis and trichomoniasis are often treated with antibiotic creams or pills. °· Yeast infections are often treated with antifungal medicines, such as vaginal creams or suppositories. °· Viral vaginitis has no cure, but symptoms can be treated with medicines that relieve discomfort. Your sexual partner should be treated as well. °· Atrophic vaginitis may be treated with an estrogen cream, pill, suppository, or vaginal ring. If vaginal dryness occurs, lubricants and moisturizing creams may help. You may be told to avoid scented soaps, sprays, or douches. °· Allergic vaginitis treatment involves quitting the use of the product that is causing the problem. Vaginal creams can be used to treat the symptoms. °HOME CARE INSTRUCTIONS  °· Take all medicines as directed by your caregiver. °· Keep your genital area clean and dry. Avoid soap and only rinse the area with water. °· Avoid douching. It can remove the healthy bacteria in the vagina. °· Do not use tampons or have sexual intercourse until your vaginitis has been treated. Use sanitary pads while you have vaginitis. °· Wipe from front to back. This avoids the spread of bacteria from the rectum to the vagina. °· Let air reach your genital area. °· Wear cotton underwear to decrease moisture buildup. °· Avoid wearing underwear while you sleep until your vaginitis is gone. °· Avoid tight pants and underwear or nylons without a cotton panel. °· Take off wet clothing (especially bathing suits) as soon as possible. °· Use mild, non-scented products. Avoid using irritants, such as: °· Scented feminine sprays. °· Fabric softeners. °· Scented detergents. °· Scented tampons. °· Scented soaps or bubble baths. °· Practice safe sex and use condoms. Condoms may prevent the spread of trichomoniasis and viral vaginitis. °SEEK MEDICAL CARE IF:  °· You have abdominal pain. °· You   have a fever or persistent symptoms for more than 2-3 days. °· You have a fever and your symptoms suddenly  get worse. °  °This information is not intended to replace advice given to you by your health care provider. Make sure you discuss any questions you have with your health care provider. °  °Document Released: 11/20/2006 Document Revised: 06/09/2014 Document Reviewed: 07/06/2011 °Elsevier Interactive Patient Education ©2016 Elsevier Inc. ° ° ° ° °Perimenopause °Perimenopause is the time when your body begins to move into the menopause (no menstrual period for 12 straight months). It is a natural process. Perimenopause can begin 2-8 years before the menopause and usually lasts for 1 year after the menopause. During this time, your ovaries may or may not produce an egg. The ovaries vary in their production of estrogen and progesterone hormones each month. This can cause irregular menstrual periods, difficulty getting pregnant, vaginal bleeding between periods, and uncomfortable symptoms. °CAUSES °· Irregular production of the ovarian hormones, estrogen and progesterone, and not ovulating every month. °· Other causes include: °¨ Tumor of the pituitary gland in the brain. °¨ Medical disease that affects the ovaries. °¨ Radiation treatment. °¨ Chemotherapy. °¨ Unknown causes. °¨ Heavy smoking and excessive alcohol intake can bring on perimenopause sooner. °SIGNS AND SYMPTOMS  °· Hot flashes. °· Night sweats. °· Irregular menstrual periods. °· Decreased sex drive. °· Vaginal dryness. °· Headaches. °· Mood swings. °· Depression. °· Memory problems. °· Irritability. °· Tiredness. °· Weight gain. °· Trouble getting pregnant. °· The beginning of losing bone cells (osteoporosis). °· The beginning of hardening of the arteries (atherosclerosis). °DIAGNOSIS  °Your health care provider will make a diagnosis by analyzing your age, menstrual history, and symptoms. He or she will do a physical exam and note any changes in your body, especially your female organs. Female hormone tests may or may not be helpful depending on the amount  of female hormones you produce and when you produce them. However, other hormone tests may be helpful to rule out other problems. °TREATMENT  °In some cases, no treatment is needed. The decision on whether treatment is necessary during the perimenopause should be made by you and your health care provider based on how the symptoms are affecting you and your lifestyle. Various treatments are available, such as: °· Treating individual symptoms with a specific medicine for that symptom. °· Herbal medicines that can help specific symptoms. °· Counseling. °· Group therapy. °HOME CARE INSTRUCTIONS  °· Keep track of your menstrual periods (when they occur, how heavy they are, how long between periods, and how long they last) as well as your symptoms and when they started. °· Only take over-the-counter or prescription medicines as directed by your health care provider. °· Sleep and rest. °· Exercise. °· Eat a diet that contains calcium (good for your bones) and soy (acts like the estrogen hormone). °· Do not smoke. °· Avoid alcoholic beverages. °· Take vitamin supplements as recommended by your health care provider. Taking vitamin E may help in certain cases. °· Take calcium and vitamin D supplements to help prevent bone loss. °· Group therapy is sometimes helpful. °· Acupuncture may help in some cases. °SEEK MEDICAL CARE IF:  °· You have questions about any symptoms you are having. °· You need a referral to a specialist (gynecologist, psychiatrist, or psychologist). °SEEK IMMEDIATE MEDICAL CARE IF:  °· You have vaginal bleeding. °· Your period lasts longer than 8 days. °· Your periods are recurring sooner than 21 days. °·   You have bleeding after intercourse. °· You have severe depression. °· You have pain when you urinate. °· You have severe headaches. °· You have vision problems. °  °This information is not intended to replace advice given to you by your health care provider. Make sure you discuss any questions you have  with your health care provider. °  °Document Released: 03/02/2004 Document Revised: 02/13/2014 Document Reviewed: 08/22/2012 °Elsevier Interactive Patient Education ©2016 Elsevier Inc. ° °

## 2015-08-23 NOTE — Progress Notes (Signed)
Patient ID: [place] Constableikelle Mcmackin, female   DOB: 1970-07-30, 45 y.o.   MRN: [id number] History:  45 y.o. [id number] here today for itching and vaginal discharge for 3 days. Pt has been swimming and wearing a wet bathing suit this summer.  She denies odor or abnormal bleeding. Pt reports that she is having a few hot flushes.  She denies abnormal bleeding but, is on OCPs   The following portions of the patient's history were reviewed and updated as appropriate: allergies, current medications, past family history, past medical history, past social history, past surgical history and problem list.  Review of Systems:  Pertinent items are noted in HPI.  Objective:  Physical Exam Blood pressure 110/68, pulse 64, height 5\' 4"  (1.626 m), weight 154 lb (69.854 kg), last menstrual period 08/16/2015. Gen: NAD Abd: Soft, nontender and nondistended Pelvic: Normal appearing external genitalia; normal appearing vaginal mucosa and cervix.  Thick whie White curd like discharge. Small amount  Labs and Imaging No results found.  Assessment & Plan:  Yeast vaginitis Hot flushes.  Diflucan 150mg  Pt will check with her primary care to see if she's had a TSH drawn.  If not, she will come in to have a TSH done. F/u prn  Anaisha Mago L. Harraway-Smith, M.D., [facility] CoreFACOG

## 2015-08-24 ENCOUNTER — Telehealth: Payer: Self-pay | Admitting: *Deleted

## 2015-08-24 LAB — WET PREP BY MOLECULAR PROBE
Candida species: POSITIVE — AB
GARDNERELLA VAGINALIS: NEGATIVE
Trichomonas vaginosis: NEGATIVE

## 2015-08-24 NOTE — Telephone Encounter (Signed)
LM on voicemail of positive yeast and the RX she received yesterday will take care of the yeast.

## 2016-09-15 ENCOUNTER — Other Ambulatory Visit: Payer: Self-pay | Admitting: Obstetrics & Gynecology

## 2016-09-15 DIAGNOSIS — Z3041 Encounter for surveillance of contraceptive pills: Secondary | ICD-10-CM

## 2016-09-22 ENCOUNTER — Ambulatory Visit: Payer: Managed Care, Other (non HMO) | Admitting: Family Medicine

## 2016-09-22 DIAGNOSIS — Z01419 Encounter for gynecological examination (general) (routine) without abnormal findings: Secondary | ICD-10-CM

## 2017-02-05 ENCOUNTER — Ambulatory Visit: Payer: Managed Care, Other (non HMO) | Admitting: Family Medicine

## 2017-02-05 ENCOUNTER — Encounter: Payer: Self-pay | Admitting: Family Medicine

## 2017-02-05 VITALS — BP 102/65 | HR 72 | Ht 64.0 in | Wt 164.0 lb

## 2017-02-05 DIAGNOSIS — N898 Other specified noninflammatory disorders of vagina: Secondary | ICD-10-CM | POA: Diagnosis not present

## 2017-02-05 DIAGNOSIS — L731 Pseudofolliculitis barbae: Secondary | ICD-10-CM

## 2017-02-05 DIAGNOSIS — N946 Dysmenorrhea, unspecified: Secondary | ICD-10-CM

## 2017-02-05 MED ORDER — LO LOESTRIN FE 1 MG-10 MCG / 10 MCG PO TABS: 1.0000 | ORAL_TABLET | Freq: Every day | ORAL | 3 refills | Status: DC

## 2017-02-05 NOTE — Progress Notes (Signed)
   Subjective:    Patient ID: Regina Schaefer, female    DOB: [birth year], 46 y.o.   MRN: [id number]  HPI Patient seen for symptoms of weight gain over past 6 months since being on depo Provera.  She started depo Provera due to dysmenorrhea and menorrhagia.  She is also experienced some hot flashes and mood irritability.  She saw her primary care provider who checked a LH and FSH and told her that she was in early menopause.  These tests were obtained while still on Depo-Provera.  Her PCP prescribed Estarylla, which she has not started taking.  She does have some vaginal itching which is intermittent.  She has had used before and would like to be checked for this. She also complains of a bump on her right labia that has been there for 2 weeks and has not changed in side.  Minimally tender on palpation.  No family history of breast, ovarian, or uterine cancers. No hx/o DVTs.  Review of Systems     Objective:   Physical Exam  Constitutional: She appears well-developed and well-nourished.  HENT:  Head: Normocephalic and atraumatic.  Right Ear: External ear normal.  Left Ear: External ear normal.  Pulmonary/Chest: Effort normal.  Abdominal: Soft.  Genitourinary:     Skin: Skin is warm and dry.  Psychiatric: She has a normal mood and affect. Her behavior is normal. Judgment and thought content normal.       Assessment & Plan:  1. Dysmenorrhea Stop depo provera due to 20# weight gain. Start lo loestrin, as she will probably tolerate this better than the 35mcg OCP tablet. Instructed the patient to notify if she is having breakthrough bleeding or symptoms.  2. Vaginal itching Wet prep - Cervicovaginal ancillary only  3. Ingrown hair Warm compresses. Return if starts to enlarge or increases in tenderness.

## 2017-02-08 LAB — CERVICOVAGINAL ANCILLARY ONLY
BACTERIAL VAGINITIS: NEGATIVE
Candida vaginitis: POSITIVE — AB

## 2017-02-09 ENCOUNTER — Other Ambulatory Visit: Payer: Self-pay | Admitting: Family Medicine

## 2017-02-09 ENCOUNTER — Encounter: Payer: Self-pay | Admitting: Family Medicine

## 2017-02-09 MED ORDER — FLUCONAZOLE 150 MG PO TABS: 150.0000 mg | ORAL_TABLET | Freq: Once | ORAL | 1 refills | Status: AC

## 2017-02-14 ENCOUNTER — Ambulatory Visit: Payer: Managed Care, Other (non HMO) | Admitting: Obstetrics & Gynecology

## 2017-02-14 ENCOUNTER — Encounter: Payer: Self-pay | Admitting: Obstetrics & Gynecology

## 2017-02-14 VITALS — BP 105/70 | HR 86 | Wt 164.0 lb

## 2017-02-14 DIAGNOSIS — B373 Candidiasis of vulva and vagina: Secondary | ICD-10-CM | POA: Diagnosis not present

## 2017-02-14 DIAGNOSIS — B3731 Acute candidiasis of vulva and vagina: Secondary | ICD-10-CM

## 2017-02-14 DIAGNOSIS — Z1239 Encounter for other screening for malignant neoplasm of breast: Secondary | ICD-10-CM

## 2017-02-14 DIAGNOSIS — N898 Other specified noninflammatory disorders of vagina: Secondary | ICD-10-CM | POA: Diagnosis not present

## 2017-02-14 MED ORDER — FLUCONAZOLE 150 MG PO TABS: 150.0000 mg | ORAL_TABLET | Freq: Once | ORAL | 0 refills | Status: AC

## 2017-02-14 NOTE — Progress Notes (Signed)
History:  47 y.o. [id number] here today for small bump on left upper thigh. This is nontender. She also reports that she has been itching on the vulva.       The following portions of the patient's history were reviewed and updated as appropriate: allergies, current medications, past family history, past medical history, past social history, past surgical history and problem list.  Review of Systems:  Pertinent items are noted in HPI.   Objective:  Physical Exam Blood pressure 105/70, pulse 86, weight 164 lb (74.4 kg). CONSTITUTIONAL: Well-developed, well-nourished female in no acute distress.  HENT:  Normocephalic, atraumatic EYES: Conjunctivae and EOM are normal. No scleral icterus.  NECK: Normal range of motion SKIN: Skin is warm and dry. No rash noted. Not diaphoretic.No pallor. NEUROLGIC: Alert and oriented to person, place, and time. Normal coordination.  Abd: Soft, nontender and nondistended Pelvic: excoriation on the right side of labia majus. The bump on her left thigh is virtually gone.     Assessment & Plan:  Vulvar bump  excoriated vulvar tissue on right side- suspect this is due to yeast vaginitis (itching)  Breast cancer screeen  F/u in 2 weeks for annual   Total face-to-face time with patient was 15 min.  Greater than 50% was spent in counseling and coordination of care with the patient.   Lissie Hinesley L. Harraway-Smith, M.D., [facility]FACOG

## 2017-02-18 ENCOUNTER — Encounter: Payer: Self-pay | Admitting: Obstetrics & Gynecology

## 2017-06-01 ENCOUNTER — Encounter (HOSPITAL_BASED_OUTPATIENT_CLINIC_OR_DEPARTMENT_OTHER): Payer: Self-pay | Admitting: Radiology

## 2017-06-01 ENCOUNTER — Ambulatory Visit (HOSPITAL_BASED_OUTPATIENT_CLINIC_OR_DEPARTMENT_OTHER)
Admission: RE | Admit: 2017-06-01 | Discharge: 2017-06-01 | Disposition: A | Discharge status: Home / Self Care | Payer: Managed Care, Other (non HMO) | Source: Ambulatory Visit | Attending: Obstetrics & Gynecology | Admitting: Obstetrics & Gynecology

## 2017-06-01 DIAGNOSIS — Z1231 Encounter for screening mammogram for malignant neoplasm of breast: Secondary | ICD-10-CM | POA: Insufficient documentation

## 2017-06-01 DIAGNOSIS — Z1239 Encounter for other screening for malignant neoplasm of breast: Secondary | ICD-10-CM

## 2017-12-18 ENCOUNTER — Other Ambulatory Visit: Payer: Self-pay | Admitting: Family Medicine

## 2018-03-28 ENCOUNTER — Ambulatory Visit (INDEPENDENT_AMBULATORY_CARE_PROVIDER_SITE_OTHER): Payer: Managed Care, Other (non HMO) | Admitting: Family Medicine

## 2018-03-28 ENCOUNTER — Encounter: Payer: Self-pay | Admitting: Family Medicine

## 2018-03-28 VITALS — BP 110/71 | HR 74 | Ht 64.0 in | Wt 153.0 lb

## 2018-03-28 DIAGNOSIS — Z01419 Encounter for gynecological examination (general) (routine) without abnormal findings: Secondary | ICD-10-CM

## 2018-03-28 DIAGNOSIS — N951 Menopausal and female climacteric states: Secondary | ICD-10-CM

## 2018-03-28 DIAGNOSIS — Z1151 Encounter for screening for human papillomavirus (HPV): Secondary | ICD-10-CM

## 2018-03-28 DIAGNOSIS — Z124 Encounter for screening for malignant neoplasm of cervix: Secondary | ICD-10-CM | POA: Diagnosis not present

## 2018-03-28 NOTE — Progress Notes (Signed)
GYNECOLOGY ANNUAL PREVENTATIVE CARE ENCOUNTER NOTE  Subjective:   Regina Schaefer is a 48 y.o. [phone] female here for a routine annual gynecologic exam.  Current complaints: hot flashes, mood swing, vaginal dryness.   Denies abnormal vaginal bleeding, discharge, pelvic pain, problems with intercourse or other gynecologic concerns.    Gynecologic History Patient's last menstrual period was 11/28/2017. Patient is sexually active  Last Pap: 2017. Results were: normal Last mammogram: 05/2017. Results were: normal  Obstetric History OB History  Gravida Para Term Preterm AB Living  4 2 1   2 2   SAB TAB Ectopic Multiple Live Births  1 1     2     # Outcome Date GA Lbr Len/2nd Weight Sex Delivery Anes PTL Lv  4 SAB 2010          3 Para 2008    F CS-LTranv EPI N LIV  2 Term 2006 [redacted]w[redacted]d   M Vag-Spont EPI N LIV  1 TAB 2001            Past Medical History:  Diagnosis Date  . Hyperlipidemia     Past Surgical History:  Procedure Laterality Date  . AUGMENTATION MAMMAPLASTY    . BACK SURGERY    . CESAREAN SECTION      Current Outpatient Medications on File Prior to Visit  Medication Sig Dispense Refill  . omeprazole (PRILOSEC) 20 MG capsule Take 20 mg by mouth daily.    . sertraline (ZOLOFT) 25 MG tablet Take 25 mg by mouth daily.    . LO LOESTRIN FE 1 MG-10 MCG / 10 MCG tablet TAKE 1 TABLET DAILY (Patient not taking: Reported on 03/28/2018) 84 tablet 4  . Multiple Vitamin (MULTIVITAMIN) tablet Take 1 tablet by mouth daily.    . promethazine (PHENERGAN) 12.5 MG tablet Take 12.5 mg by mouth every 6 (six) hours as needed for nausea or vomiting.     No current facility-administered medications on file prior to visit.     Allergies  Allergen Reactions  . Chicken Allergy   . Gluten Meal   . Orphenadrine     Other reaction(s): Confusion  . Soy Allergy   . Shellfish Allergy Nausea Only    Social History   Socioeconomic History  . Marital status: Married    Spouse name: Not on  file  . Number of children: Not on file  . Years of education: Not on file  . Highest education level: Not on file  Occupational History  . Not on file  Social Needs  . Financial resource strain: Not on file  . Food insecurity:    Worry: Not on file    Inability: Not on file  . Transportation needs:    Medical: Not on file    Non-medical: Not on file  Tobacco Use  . Smoking status: Never Smoker  . Smokeless tobacco: Never Used  Substance and Sexual Activity  . Alcohol use: No    Alcohol/week: 0.0 standard drinks  . Drug use: No  . Sexual activity: Yes  Lifestyle  . Physical activity:    Days per week: Not on file    Minutes per session: Not on file  . Stress: Not on file  Relationships  . Social connections:    Talks on phone: Not on file    Gets together: Not on file    Attends religious service: Not on file    Active member of club or organization: Not on file    Attends  meetings of clubs or organizations: Not on file    Relationship status: Not on file  . Intimate partner violence:    Fear of current or ex partner: Not on file    Emotionally abused: Not on file    Physically abused: Not on file    Forced sexual activity: Not on file  Other Topics Concern  . Not on file  Social History Narrative  . Not on file    Family History  Problem Relation Age of Onset  . Cancer Father        leukemia  . Diabetes Father   . Hypertension Father   . Stroke Neg Hx     The following portions of the patient's history were reviewed and updated as appropriate: allergies, current medications, past family history, past medical history, past social history, past surgical history and problem list.  Review of Systems Pertinent items are noted in HPI.   Objective:  BP 110/71   Pulse 74   Ht 5\' 4"  (1.626 m)   Wt 153 lb 0.6 oz (69.4 kg)   LMP 11/28/2017   BMI 26.27 kg/m  Wt Readings from Last 3 Encounters:  03/28/18 153 lb 0.6 oz (69.4 kg)  02/14/17 164 lb (74.4 kg)    02/05/17 164 lb (74.4 kg)     CONSTITUTIONAL: Well-developed, well-nourished female in no acute distress.  HENT:  Normocephalic, atraumatic, External right and left ear normal. Oropharynx is clear and moist EYES: Conjunctivae and EOM are normal. Pupils are equal, round, and reactive to light. No scleral icterus.  NECK: Normal range of motion, supple, no masses.  Normal thyroid.   CARDIOVASCULAR: Normal heart rate noted, regular rhythm RESPIRATORY: Clear to auscultation bilaterally. Effort and breath sounds normal, no problems with respiration noted. BREASTS: Symmetric in size. No masses, skin changes, nipple drainage, or lymphadenopathy. ABDOMEN: Soft, normal bowel sounds, no distention noted.  No tenderness, rebound or guarding.  PELVIC: Normal appearing external genitalia; normal appearing vaginal mucosa and cervix.  No abnormal discharge noted.  Normal uterine size, no other palpable masses, no uterine or adnexal tenderness. MUSCULOSKELETAL: Normal range of motion. No tenderness.  No cyanosis, clubbing, or edema.  2+ distal pulses. SKIN: Skin is warm and dry. No rash noted. Not diaphoretic. No erythema. No pallor. NEUROLOGIC: Alert and oriented to person, place, and time. Normal reflexes, muscle tone coordination. No cranial nerve deficit noted. PSYCHIATRIC: Normal mood and affect. Normal behavior. Normal judgment and thought content.  Assessment:  Annual gynecologic examination with pap smear   Plan:  1. Well Woman Exam Will follow up results of pap smear and manage accordingly.  2. Perimenopause Discussed vaginal lubrication for sex. Was started on zoloft 25mg  for mood swing by PCP - hasn't started it yet. Discussed potential for decreased libido. Discussed evening primrose oil. F/u prn   Routine preventative health maintenance measures emphasized. Please refer to After Visit Summary for other counseling recommendations.    Candelaria Celeste, DO [facility]

## 2018-03-28 NOTE — Patient Instructions (Signed)
Menopause  Menopause is the normal time of life when menstrual periods stop completely. It is usually confirmed by 12 months without a menstrual period. The transition to menopause (perimenopause) most often happens between the ages of 45 and 55. During perimenopause, hormone levels change in your body, which can cause symptoms and affect your health. Menopause may increase your risk for:   Loss of bone (osteoporosis), which causes bone breaks (fractures).   Depression.   Hardening and narrowing of the arteries (atherosclerosis), which can cause heart attacks and strokes.  What are the causes?  This condition is usually caused by a natural change in hormone levels that happens as you get older. The condition may also be caused by surgery to remove both ovaries (bilateral oophorectomy).  What increases the risk?  This condition is more likely to start at an earlier age if you have certain medical conditions or treatments, including:   A tumor of the pituitary gland in the brain.   A disease that affects the ovaries and hormone production.   Radiation treatment for cancer.   Certain cancer treatments, such as chemotherapy or hormone (anti-estrogen) therapy.   Heavy smoking and excessive alcohol use.   Family history of early menopause.  This condition is also more likely to develop earlier in women who are very thin.  What are the signs or symptoms?  Symptoms of this condition include:   Hot flashes.   Irregular menstrual periods.   Night sweats.   Changes in feelings about sex. This could be a decrease in sex drive or an increased comfort around your sexuality.   Vaginal dryness and thinning of the vaginal walls. This may cause painful intercourse.   Dryness of the skin and development of wrinkles.   Headaches.   Problems sleeping (insomnia).   Mood swings or irritability.   Memory problems.   Weight gain.   Hair growth on the face and chest.   Bladder infections or problems with urinating.  How  is this diagnosed?  This condition is diagnosed based on your medical history, a physical exam, your age, your menstrual history, and your symptoms. Hormone tests may also be done.  How is this treated?  In some cases, no treatment is needed. You and your health care provider should make a decision together about whether treatment is necessary. Treatment will be based on your individual condition and preferences. Treatment for this condition focuses on managing symptoms. Treatment may include:   Menopausal hormone therapy (MHT).   Medicines to treat specific symptoms or complications.   Acupuncture.   Vitamin or herbal supplements.  Before starting treatment, make sure to let your health care provider know if you have a personal or family history of:   Heart disease.   Breast cancer.   Blood clots.   Diabetes.   Osteoporosis.  Follow these instructions at home:  Lifestyle   Do not use any products that contain nicotine or tobacco, such as cigarettes and e-cigarettes. If you need help quitting, ask your health care provider.   Get at least 30 minutes of physical activity on 5 or more days each week.   Avoid alcoholic and caffeinated beverages, as well as spicy foods. This may help prevent hot flashes.   Get 7-8 hours of sleep each night.   If you have hot flashes, try:  ? Dressing in layers.  ? Avoiding things that may trigger hot flashes, such as spicy food, warm places, or stress.  ? Taking slow, deep   breaths when a hot flash starts.  ? Keeping a fan in your home and office.   Find ways to manage stress, such as deep breathing, meditation, or journaling.   Consider going to group therapy with other women who are having menopause symptoms. Ask your health care provider about recommended group therapy meetings.  Eating and drinking   Eat a healthy, balanced diet that contains whole grains, lean protein, low-fat dairy, and plenty of fruits and vegetables.   Your health care provider may recommend  adding more soy to your diet. Foods that contain soy include tofu, tempeh, and soy milk.   Eat plenty of foods that contain calcium and vitamin D for bone health. Items that are rich in calcium include low-fat milk, yogurt, beans, almonds, sardines, broccoli, and kale.  Medicines   Take over-the-counter and prescription medicines only as told by your health care provider.   Talk with your health care provider before starting any herbal supplements. If prescribed, take vitamins and supplements as told by your health care provider. These may include:  ? Calcium. Women age 51 and older should get 1,200 mg (milligrams) of calcium every day.  ? Vitamin D. Women need 600-800 International Units of vitamin D each day.  ? Vitamins B12 and B6. Aim for 50 micrograms of B12 and 1.5 mg of B6 each day.  General instructions   Keep track of your menstrual periods, including:  ? When they occur.  ? How heavy they are and how long they last.  ? How much time passes between periods.   Keep track of your symptoms, noting when they start, how often you have them, and how long they last.   Use vaginal lubricants or moisturizers to help with vaginal dryness and improve comfort during sex.   Keep all follow-up visits as told by your health care provider. This is important. This includes any group therapy or counseling.  Contact a health care provider if:   You are still having menstrual periods after age 55.   You have pain during sex.   You have not had a period for 12 months and you develop vaginal bleeding.  Get help right away if:   You have:  ? Severe depression.  ? Excessive vaginal bleeding.  ? Pain when you urinate.  ? A fast or irregular heart beat (palpitations).  ? Severe headaches.  ? Abdomen (abdominal) pain or severe indigestion.   You fell and you think you have a broken bone.   You develop leg or chest pain.   You develop vision problems.   You feel a lump in your breast.  Summary   Menopause is the normal  time of life when menstrual periods stop completely. It is usually confirmed by 12 months without a menstrual period.   The transition to menopause (perimenopause) most often happens between the ages of 45 and 55.   Symptoms can be managed through medicines, lifestyle changes, and complementary therapies such as acupuncture.   Eat a balanced diet that is rich in nutrients to promote bone health and heart health and to manage symptoms during menopause.  This information is not intended to replace advice given to you by your health care provider. Make sure you discuss any questions you have with your health care provider.  Document Released: 04/15/2003 Document Revised: 02/26/2016 Document Reviewed: 02/26/2016  Elsevier Interactive Patient Education  2019 Elsevier Inc.

## 2018-04-02 LAB — CYTOLOGY - PAP
ADEQUACY: ABSENT
DIAGNOSIS: NEGATIVE
HPV (WINDOPATH): NOT DETECTED

## 2018-12-03 DIAGNOSIS — R591 Generalized enlarged lymph nodes: Secondary | ICD-10-CM | POA: Insufficient documentation

## 2018-12-18 ENCOUNTER — Ambulatory Visit: Payer: Managed Care, Other (non HMO)

## 2018-12-30 ENCOUNTER — Ambulatory Visit (INDEPENDENT_AMBULATORY_CARE_PROVIDER_SITE_OTHER): Payer: Managed Care, Other (non HMO)

## 2018-12-30 ENCOUNTER — Other Ambulatory Visit: Payer: Self-pay

## 2018-12-30 VITALS — BP 109/60 | HR 73 | Ht 64.0 in | Wt 150.0 lb

## 2018-12-30 DIAGNOSIS — Z113 Encounter for screening for infections with a predominantly sexual mode of transmission: Secondary | ICD-10-CM

## 2018-12-30 DIAGNOSIS — N898 Other specified noninflammatory disorders of vagina: Secondary | ICD-10-CM | POA: Diagnosis not present

## 2018-12-30 NOTE — Progress Notes (Addendum)
SUBJECTIVE:  48 y.o. female complains of foul vaginal discharge for 1 week(s). Denies abnormal vaginal bleeding or significant pelvic pain or fever. No UTI symptoms. Denies history of known exposure to STD.  Patient's last menstrual period was 12/18/2018.  OBJECTIVE:  She appears well, afebrile. Urine dipstick: not done.  ASSESSMENT:  Vaginal Discharge  Vaginal Odor   PLAN:  GC, chlamydia, trichomonas, BVAG, CVAG probe sent to lab. Treatment: To be determined once lab results are received ROV prn if symptoms persist or worsen. Olanda Downie l Jeret Goyer, CMA  Attestation of Attending Supervision of CMA/RN: Evaluation and management procedures were performed by the nurse under my supervision and collaboration.  I have reviewed the nursing note and chart, and I agree with the management and plan.  Carolyn L. Harraway-Smith, M.D., Cherlynn June

## 2018-12-31 LAB — CERVICOVAGINAL ANCILLARY ONLY
Bacterial Vaginitis (gardnerella): NEGATIVE
Candida Glabrata: NEGATIVE
Candida Vaginitis: NEGATIVE
Chlamydia: NEGATIVE
Comment: NEGATIVE
Comment: NEGATIVE
Comment: NEGATIVE
Comment: NEGATIVE
Comment: NEGATIVE
Comment: NORMAL
Neisseria Gonorrhea: NEGATIVE
Trichomonas: NEGATIVE

## 2019-04-12 ENCOUNTER — Ambulatory Visit: Payer: Managed Care, Other (non HMO) | Attending: Internal Medicine

## 2019-04-12 DIAGNOSIS — Z23 Encounter for immunization: Secondary | ICD-10-CM | POA: Insufficient documentation

## 2019-04-12 NOTE — Progress Notes (Signed)
   Covid-19 Vaccination Clinic  Name:  Regina Schaefer    MRN: [id number] DOB: 1970/05/14  04/12/2019  [name] was observed post Covid-19 immunization for 15 minutes without incident. She was provided with Vaccine Information Sheet and instruction to access the V-Safe system.   [name] was instructed to call 911 with any severe reactions post vaccine: Marland Kitchen Difficulty breathing  . Swelling of face and throat  . A fast heartbeat  . A bad rash all over body  . Dizziness and weakness   Immunizations Administered    Name Date Dose VIS Date Route   Pfizer COVID-19 Vaccine 04/12/2019  9:31 AM 0.3 mL 01/17/2019 Intramuscular   Manufacturer: [employer], [employer]   Lot: [id number]   NDC: [address]-9

## 2019-05-03 ENCOUNTER — Ambulatory Visit: Payer: Managed Care, Other (non HMO) | Attending: Internal Medicine

## 2019-05-03 DIAGNOSIS — Z23 Encounter for immunization: Secondary | ICD-10-CM

## 2019-05-03 NOTE — Progress Notes (Signed)
   Covid-19 Vaccination Clinic  Name:  Regina Schaefer    MRN: [id number] DOB: [birth year]  05/03/2019  Regina Schaefer was observed post Covid-19 immunization for 15 minutes without incident. She was provided with Vaccine Information Sheet and instruction to access the V-Safe system.   Regina Schaefer was instructed to call 911 with any severe reactions post vaccine: Marland Kitchen Difficulty breathing  . Swelling of face and throat  . A fast heartbeat  . A bad rash all over body  . Dizziness and weakness   Immunizations Administered    Name Date Dose VIS Date Route   Pfizer COVID-19 Vaccine 05/03/2019 10:03 AM 0.3 mL 01/17/2019 Intramuscular   Manufacturer: [employer], [employer]   Lot: [id number]   NDC: [address]-7

## 2019-05-08 ENCOUNTER — Other Ambulatory Visit (HOSPITAL_COMMUNITY)
Admission: RE | Admit: 2019-05-08 | Discharge: 2019-05-08 | Disposition: A | Discharge status: Home / Self Care | Payer: Managed Care, Other (non HMO) | Source: Ambulatory Visit | Attending: Family Medicine | Admitting: Family Medicine

## 2019-05-08 ENCOUNTER — Ambulatory Visit (INDEPENDENT_AMBULATORY_CARE_PROVIDER_SITE_OTHER): Payer: Managed Care, Other (non HMO) | Admitting: Family Medicine

## 2019-05-08 ENCOUNTER — Other Ambulatory Visit: Payer: Self-pay

## 2019-05-08 ENCOUNTER — Encounter: Payer: Self-pay | Admitting: Family Medicine

## 2019-05-08 VITALS — BP 103/67 | HR 72 | Ht 64.0 in | Wt 159.0 lb

## 2019-05-08 DIAGNOSIS — Z1231 Encounter for screening mammogram for malignant neoplasm of breast: Secondary | ICD-10-CM

## 2019-05-08 DIAGNOSIS — Z01419 Encounter for gynecological examination (general) (routine) without abnormal findings: Secondary | ICD-10-CM

## 2019-05-08 MED ORDER — TRIAMCINOLONE ACETONIDE 0.5 % EX OINT: 1.0000 "application " | TOPICAL_OINTMENT | Freq: Two times a day (BID) | CUTANEOUS | 0 refills | Status: DC

## 2019-05-08 NOTE — Progress Notes (Signed)
GYNECOLOGY ANNUAL PREVENTATIVE CARE ENCOUNTER NOTE  Subjective:   Regina Schaefer is a 49 y.o. [phone] female here for a routine annual gynecologic exam.  Current complaints: irregular cycles. Some perimenopausal symptoms: mood changes, vaginal dryness. Minimal vasomotor symptoms.   Denies abnormal vaginal bleeding, discharge, pelvic pain, problems with intercourse or other gynecologic concerns.    Gynecologic History Patient's last menstrual period was 04/21/2019. Patient is sexually active  Last Pap: 2020. Results were: normal Last mammogram: 2019. Results were: normal  Obstetric History OB History  Gravida Para Term Preterm AB Living  4 2 1   2 2   SAB TAB Ectopic Multiple Live Births  1 1     2     # Outcome Date GA Lbr Len/2nd Weight Sex Delivery Anes PTL Lv  4 SAB 2010          3 Para 2008    F CS-LTranv EPI N LIV  2 Term 2006 [redacted]w[redacted]d   M Vag-Spont EPI N LIV  1 TAB 2001            Past Medical History:  Diagnosis Date  . Hyperlipidemia     Past Surgical History:  Procedure Laterality Date  . AUGMENTATION MAMMAPLASTY    . BACK SURGERY    . CESAREAN SECTION      Current Outpatient Medications on File Prior to Visit  Medication Sig Dispense Refill  . LO LOESTRIN FE 1 MG-10 MCG / 10 MCG tablet TAKE 1 TABLET DAILY (Patient not taking: Reported on 03/28/2018) 84 tablet 4  . Multiple Vitamin (MULTIVITAMIN) tablet Take 1 tablet by mouth daily.    Marland Kitchen omeprazole (PRILOSEC) 20 MG capsule Take 20 mg by mouth daily.    . promethazine (PHENERGAN) 12.5 MG tablet Take 12.5 mg by mouth every 6 (six) hours as needed for nausea or vomiting.    . sertraline (ZOLOFT) 25 MG tablet Take 25 mg by mouth daily.     No current facility-administered medications on file prior to visit.    Allergies  Allergen Reactions  . Chicken Allergy   . Gluten Meal   . Orphenadrine     Other reaction(s): Confusion  . Soy Allergy   . Shellfish Allergy Nausea Only    Social History    Socioeconomic History  . Marital status: Married    Spouse name: Not on file  . Number of children: Not on file  . Years of education: Not on file  . Highest education level: Not on file  Occupational History  . Not on file  Tobacco Use  . Smoking status: Never Smoker  . Smokeless tobacco: Never Used  Substance and Sexual Activity  . Alcohol use: No    Alcohol/week: 0.0 standard drinks  . Drug use: No  . Sexual activity: Yes  Other Topics Concern  . Not on file  Social History Narrative  . Not on file   Social Determinants of Health   Financial Resource Strain:   . Difficulty of Paying Living Expenses:   Food Insecurity:   . Worried About Charity fundraiser in the Last Year:   . Arboriculturist in the Last Year:   Transportation Needs:   . Film/video editor (Medical):   Marland Kitchen Lack of Transportation (Non-Medical):   Physical Activity:   . Days of Exercise per Week:   . Minutes of Exercise per Session:   Stress:   . Feeling of Stress :   Social Connections:   .  Frequency of Communication with Friends and Family:   . Frequency of Social Gatherings with Friends and Family:   . Attends Religious Services:   . Active Member of Clubs or Organizations:   . Attends Banker Meetings:   Marland Kitchen Marital Status:   Intimate Partner Violence:   . Fear of Current or Ex-Partner:   . Emotionally Abused:   Marland Kitchen Physically Abused:   . Sexually Abused:     Family History  Problem Relation Age of Onset  . Cancer Father        leukemia  . Diabetes Father   . Hypertension Father   . Stroke Neg Hx     The following portions of the patient's history were reviewed and updated as appropriate: allergies, current medications, past family history, past medical history, past social history, past surgical history and problem list.  Review of Systems Pertinent items are noted in HPI.   Objective:  BP 103/67   Pulse 72   Ht 5\' 4"  (1.626 m)   Wt 159 lb (72.1 kg)   LMP  04/21/2019   BMI 27.29 kg/m  Wt Readings from Last 3 Encounters:  05/08/19 159 lb (72.1 kg)  12/30/18 150 lb (68 kg)  03/28/18 153 lb 0.6 oz (69.4 kg)     Chaperone present during exam  CONSTITUTIONAL: Well-developed, well-nourished female in no acute distress.  HENT:  Normocephalic, atraumatic, External right and left ear normal. Oropharynx is clear and moist EYES: Conjunctivae and EOM are normal. Pupils are equal, round, and reactive to light. No scleral icterus.  NECK: Normal range of motion, supple, no masses.  Normal thyroid.   CARDIOVASCULAR: Normal heart rate noted, regular rhythm RESPIRATORY: Clear to auscultation bilaterally. Effort and breath sounds normal, no problems with respiration noted. BREASTS: Symmetric in size. No masses, skin changes, nipple drainage, or lymphadenopathy. ABDOMEN: Soft, normal bowel sounds, no distention noted.  No tenderness, rebound or guarding.  PELVIC: Normal appearing external genitalia; normal appearing vaginal mucosa and cervix.  No abnormal discharge noted.  Normal uterine size, no other palpable masses, no uterine or adnexal tenderness. MUSCULOSKELETAL: Normal range of motion. No tenderness.  No cyanosis, clubbing, or edema.  2+ distal pulses. SKIN: Skin is warm and dry. No rash noted. Not diaphoretic. No erythema. No pallor. NEUROLOGIC: Alert and oriented to person, place, and time. Normal reflexes, muscle tone coordination. No cranial nerve deficit noted. PSYCHIATRIC: Normal mood and affect. Normal behavior. Normal judgment and thought content.  Assessment:  Annual gynecologic examination with pap smear   Plan:  1. Screening mammogram, encounter for - MM 3D SCREEN BREAST W/IMPLANT BILATERAL; Future  2. Well woman exam with routine gynecological exam Will follow up results of pap smear and manage accordingly. Mammogram scheduled - Cytology - PAP( [facility])   Routine preventative health maintenance measures emphasized. Please  refer to After Visit Summary for other counseling recommendations.    03/30/18, DO [facility]

## 2019-05-09 ENCOUNTER — Ambulatory Visit (HOSPITAL_BASED_OUTPATIENT_CLINIC_OR_DEPARTMENT_OTHER): Payer: Managed Care, Other (non HMO)

## 2019-05-12 LAB — CYTOLOGY - PAP
Chlamydia: NEGATIVE
Comment: NEGATIVE
Comment: NEGATIVE
Comment: NORMAL
Diagnosis: REACTIVE
High risk HPV: NEGATIVE
Neisseria Gonorrhea: NEGATIVE

## 2019-07-02 ENCOUNTER — Other Ambulatory Visit: Payer: Self-pay

## 2019-07-02 DIAGNOSIS — Z3041 Encounter for surveillance of contraceptive pills: Secondary | ICD-10-CM

## 2019-07-02 MED ORDER — LO LOESTRIN FE 1 MG-10 MCG / 10 MCG PO TABS: 1.0000 | ORAL_TABLET | Freq: Every day | ORAL | 4 refills | Status: DC

## 2019-07-10 ENCOUNTER — Other Ambulatory Visit: Payer: Self-pay | Admitting: Family Medicine

## 2019-07-10 MED ORDER — NORGESTIMATE-ETH ESTRADIOL 0.25-35 MG-MCG PO TABS: 1.0000 | ORAL_TABLET | Freq: Every day | ORAL | 3 refills | Status: DC

## 2019-07-16 ENCOUNTER — Ambulatory Visit (HOSPITAL_BASED_OUTPATIENT_CLINIC_OR_DEPARTMENT_OTHER)
Admission: RE | Admit: 2019-07-16 | Discharge: 2019-07-16 | Disposition: A | Discharge status: Home / Self Care | Payer: Managed Care, Other (non HMO) | Source: Ambulatory Visit | Attending: Family Medicine | Admitting: Family Medicine

## 2019-07-16 ENCOUNTER — Other Ambulatory Visit: Payer: Self-pay

## 2019-07-16 DIAGNOSIS — Z1231 Encounter for screening mammogram for malignant neoplasm of breast: Secondary | ICD-10-CM | POA: Diagnosis not present

## 2019-07-18 ENCOUNTER — Other Ambulatory Visit: Payer: Self-pay | Admitting: Family Medicine

## 2019-07-18 DIAGNOSIS — R928 Other abnormal and inconclusive findings on diagnostic imaging of breast: Secondary | ICD-10-CM

## 2019-07-28 ENCOUNTER — Telehealth: Payer: Self-pay

## 2019-07-28 NOTE — Telephone Encounter (Signed)
Patient returned call to office and made her aware that she will need a pill with progesterone and estrogen in it. Armandina Stammer RN

## 2019-07-28 NOTE — Telephone Encounter (Signed)
There should be a progesterone type as well: norgestimate, norethindrone, etc. As long as the birth control has both the estrogen and progesterone, it should be fine.

## 2019-07-28 NOTE — Telephone Encounter (Signed)
Patient is on vacation in [place]. Patient was given birth control and lost her prescription en route to [place].  Patient states that the pharmacy there has ethinyl-estradiol .035 mg and she is wondering if that is an ok replacement for her birth control  Will route to provider for input. Patient states she will check her mychart later today. Armandina Stammer RN

## 2019-08-05 ENCOUNTER — Ambulatory Visit: Payer: Managed Care, Other (non HMO)

## 2019-08-05 ENCOUNTER — Ambulatory Visit
Admission: RE | Admit: 2019-08-05 | Discharge: 2019-08-05 | Disposition: A | Discharge status: Home / Self Care | Payer: Managed Care, Other (non HMO) | Source: Ambulatory Visit | Attending: Family Medicine | Admitting: Family Medicine

## 2019-08-05 ENCOUNTER — Other Ambulatory Visit: Payer: Self-pay

## 2019-08-05 DIAGNOSIS — R928 Other abnormal and inconclusive findings on diagnostic imaging of breast: Secondary | ICD-10-CM

## 2019-08-21 ENCOUNTER — Other Ambulatory Visit: Payer: Self-pay

## 2019-08-21 ENCOUNTER — Ambulatory Visit: Payer: Managed Care, Other (non HMO) | Admitting: Podiatry

## 2019-08-21 ENCOUNTER — Encounter: Payer: Self-pay | Admitting: Podiatry

## 2019-08-21 ENCOUNTER — Ambulatory Visit (INDEPENDENT_AMBULATORY_CARE_PROVIDER_SITE_OTHER): Payer: Managed Care, Other (non HMO)

## 2019-08-21 VITALS — Temp 97.7°F

## 2019-08-21 DIAGNOSIS — S9032XA Contusion of left foot, initial encounter: Secondary | ICD-10-CM | POA: Diagnosis not present

## 2019-08-21 DIAGNOSIS — M7752 Other enthesopathy of left foot: Secondary | ICD-10-CM

## 2019-08-21 DIAGNOSIS — M779 Enthesopathy, unspecified: Secondary | ICD-10-CM

## 2019-08-21 MED ORDER — DICLOFENAC SODIUM 75 MG PO TBEC
75.0000 mg | DELAYED_RELEASE_TABLET | Freq: Two times a day (BID) | ORAL | 2 refills | Status: DC
Start: 2019-08-21 — End: 2020-09-27

## 2019-08-21 NOTE — Progress Notes (Signed)
Subjective:   Patient ID: Regina Schaefer, female   DOB: 49 y.o.   MRN: [id number]   HPI Patient states she traumatized her left foot around 6 weeks ago and its been sore on top.  She had x-ray previously did not see a fracture but she is concerned about fracture dislocation and states that she feels like there is a click in there at times.  Patient does not smoke likes to be active   Review of Systems  All other systems reviewed and are negative.       Objective:  Physical Exam Vitals and nursing note reviewed.  Constitutional:      Appearance: She is well-developed.  Pulmonary:     Effort: Pulmonary effort is normal.  Musculoskeletal:        General: Normal range of motion.  Skin:    General: Skin is warm.  Neurological:     Mental Status: She is alert.     Neurovascular status intact muscle strength adequate range of motion within normal limits.  Patient is noted to have inflammation dorsal left foot around the extensor tendon complex with no clicking or abnormal bone movement that I could see.  There is quite a bit of pain associated with it and patient is found to have good digit perfusion well oriented x3     Assessment:  Inflammatory tendinitis with fluid buildup around the left forefoot proximal portion with possibility for bone injury or displacement     Plan:  H&P all conditions x-rays reviewed.  Today I went ahead and I did a sterile prep and I injected the dorsal tendon complex 3 mg Kenalog 5 mg Xylocaine advised on heat ice therapy oral anti-inflammatories and if symptoms persist will be seen back  X-rays were negative for signs of fracture or bony injury appears to be soft tissue

## 2019-09-18 ENCOUNTER — Other Ambulatory Visit: Payer: Self-pay

## 2019-09-18 DIAGNOSIS — Z3041 Encounter for surveillance of contraceptive pills: Secondary | ICD-10-CM

## 2019-09-18 MED ORDER — NORGESTIMATE-ETH ESTRADIOL 0.25-35 MG-MCG PO TABS: 1.0000 | ORAL_TABLET | Freq: Every day | ORAL | 3 refills | Status: DC

## 2019-09-18 NOTE — Progress Notes (Signed)
Pt called requesting to have OCP Rx sent to [facility]. Medication was sent to the pharmacy. Latanga Nedrow l Crystel Demarco, CMA

## 2019-12-16 ENCOUNTER — Other Ambulatory Visit: Payer: Self-pay

## 2019-12-16 DIAGNOSIS — Z3041 Encounter for surveillance of contraceptive pills: Secondary | ICD-10-CM

## 2019-12-16 MED ORDER — NORGESTIMATE-ETH ESTRADIOL 0.25-35 MG-MCG PO TABS: 1.0000 | ORAL_TABLET | Freq: Every day | ORAL | 6 refills | Status: DC

## 2019-12-16 NOTE — Progress Notes (Signed)
Patient called requesting refill on birth control pills Annual due 05-2020. Victorino Dike [facility]

## 2019-12-17 ENCOUNTER — Other Ambulatory Visit: Payer: Self-pay

## 2019-12-17 DIAGNOSIS — Z3041 Encounter for surveillance of contraceptive pills: Secondary | ICD-10-CM

## 2019-12-17 MED ORDER — NORGESTIMATE-ETH ESTRADIOL 0.25-35 MG-MCG PO TABS: 1.0000 | ORAL_TABLET | Freq: Every day | ORAL | 2 refills | Status: DC

## 2020-09-09 ENCOUNTER — Ambulatory Visit: Payer: Managed Care, Other (non HMO) | Admitting: Obstetrics and Gynecology

## 2020-09-09 ENCOUNTER — Encounter: Payer: Self-pay | Admitting: Obstetrics and Gynecology

## 2020-09-09 ENCOUNTER — Other Ambulatory Visit (HOSPITAL_COMMUNITY)
Admission: RE | Admit: 2020-09-09 | Discharge: 2020-09-09 | Disposition: A | Discharge status: Home / Self Care | Payer: Managed Care, Other (non HMO) | Source: Ambulatory Visit | Attending: Obstetrics and Gynecology | Admitting: Obstetrics and Gynecology

## 2020-09-09 ENCOUNTER — Other Ambulatory Visit: Payer: Self-pay

## 2020-09-09 VITALS — BP 110/74 | HR 66 | Wt 165.0 lb

## 2020-09-09 DIAGNOSIS — R1030 Lower abdominal pain, unspecified: Secondary | ICD-10-CM | POA: Diagnosis present

## 2020-09-09 DIAGNOSIS — N951 Menopausal and female climacteric states: Secondary | ICD-10-CM

## 2020-09-09 DIAGNOSIS — N939 Abnormal uterine and vaginal bleeding, unspecified: Secondary | ICD-10-CM

## 2020-09-09 DIAGNOSIS — Z113 Encounter for screening for infections with a predominantly sexual mode of transmission: Secondary | ICD-10-CM | POA: Insufficient documentation

## 2020-09-09 DIAGNOSIS — B373 Candidiasis of vulva and vagina: Secondary | ICD-10-CM | POA: Insufficient documentation

## 2020-09-09 NOTE — Progress Notes (Signed)
GYNECOLOGY OFFICE NOTE  History:  50 y.o. [id number] here today for abdominal pain and bleeding. She has not had a period in 6 months, not on OCPs bc no periods. Has had 2 episodes of 2-3 days of really bad cramping like she thought she would start her period but it did not start. Today, woke up with same bad cramping this am and then started bleeding heavily several hours later. She has soaked through clothes already today.   Also feeling significant menopausal symptoms like mood swings, weight gain and generally not feeling like herself.  Past Medical History:  Diagnosis Date   Hyperlipidemia     Past Surgical History:  Procedure Laterality Date   AUGMENTATION MAMMAPLASTY     BACK SURGERY     CESAREAN SECTION       Current Outpatient Medications:    cholecalciferol (VITAMIN D3) 25 MCG (1000 UNIT) tablet, Take 1,000 Units by mouth daily., Disp: , Rfl:    diclofenac (VOLTAREN) 75 MG EC tablet, Take 1 tablet (75 mg total) by mouth 2 (two) times daily. (Patient not taking: Reported on 09/09/2020), Disp: 50 tablet, Rfl: 2   Multiple Vitamin (MULTIVITAMIN) tablet, Take 1 tablet by mouth daily. (Patient not taking: Reported on 09/09/2020), Disp: , Rfl:   The following portions of the patient's history were reviewed and updated as appropriate: allergies, current medications, past family history, past medical history, past social history, past surgical history and problem list.   Review of Systems:  Pertinent items noted in HPI and remainder of comprehensive ROS otherwise negative.   Objective:  Physical Exam BP 110/74   Pulse 66   Wt 165 lb (74.8 kg)   LMP 09/09/2020   BMI 28.32 kg/m  CONSTITUTIONAL: Well-developed, well-nourished female in no acute distress.  HENT:  Normocephalic, atraumatic. External right and left ear normal. Oropharynx is clear and moist EYES: Conjunctivae and EOM are normal. Pupils are equal, round, and reactive to light. No scleral icterus.  NECK: Normal range  of motion, supple, no masses SKIN: Skin is warm and dry. No rash noted. Not diaphoretic. No erythema. No pallor. NEUROLOGIC: Alert and oriented to person, place, and time. Normal reflexes, muscle tone coordination. No cranial nerve deficit noted. PSYCHIATRIC: Normal mood and affect. Normal behavior. Normal judgment and thought content. CARDIOVASCULAR: Normal heart rate noted RESPIRATORY: Effort normal, no problems with respiration noted ABDOMEN: Soft, no distention noted.   PELVIC: Normal appearing external genitalia; normal appearing vaginal mucosa and cervix.  No abnormal discharge noted.  pelvic cultures obtained via self swab Normal uterine size, no other palpable masses, mild right adnexal tenderness. MUSCULOSKELETAL: Normal range of motion. No edema noted.  Exam done with chaperone present.  Labs and Imaging No results found.  Assessment & Plan:  1. Lower abdominal pain - Suspect pain due to hormonal fluctuations of perimenopause, however she describes debilitating symptoms for 2-3 days which is very abnormal for her - [place] to rule out fibroids - Cervicovaginal ancillary only - rule out infectious cause  2. Abnormal uterine bleeding (AUB) - suspect hormonal fluctuations associated with perimenopause however will rule out concern for malignancy or polyp - TVUS ordered - return for EMB  3. Menopausal symptoms Discuss at annual/next visit  Routine preventative health maintenance measures emphasized. Please refer to After Visit Summary for other counseling recommendations.   Return in about 2 weeks (around 09/23/2020) for endometrial biopsy after TVUS.    Baldemar Lenis, MD, [facility] Attending Center for [facility])

## 2020-09-10 ENCOUNTER — Ambulatory Visit (HOSPITAL_BASED_OUTPATIENT_CLINIC_OR_DEPARTMENT_OTHER)
Admission: RE | Admit: 2020-09-10 | Discharge: 2020-09-10 | Disposition: A | Discharge status: Home / Self Care | Payer: Managed Care, Other (non HMO) | Source: Ambulatory Visit | Attending: Obstetrics and Gynecology | Admitting: Obstetrics and Gynecology

## 2020-09-10 ENCOUNTER — Other Ambulatory Visit: Payer: Self-pay | Admitting: Family Medicine

## 2020-09-10 ENCOUNTER — Other Ambulatory Visit: Payer: Self-pay

## 2020-09-10 DIAGNOSIS — R1030 Lower abdominal pain, unspecified: Secondary | ICD-10-CM | POA: Diagnosis not present

## 2020-09-10 LAB — CERVICOVAGINAL ANCILLARY ONLY
Bacterial Vaginitis (gardnerella): NEGATIVE
Candida Glabrata: NEGATIVE
Candida Vaginitis: POSITIVE — AB
Chlamydia: NEGATIVE
Comment: NEGATIVE
Comment: NEGATIVE
Comment: NEGATIVE
Comment: NEGATIVE
Comment: NEGATIVE
Comment: NORMAL
Neisseria Gonorrhea: NEGATIVE
Trichomonas: NEGATIVE

## 2020-09-10 MED ORDER — FLUCONAZOLE 150 MG PO TABS: 150.0000 mg | ORAL_TABLET | Freq: Once | ORAL | 0 refills | Status: AC

## 2020-09-10 MED ORDER — IBUPROFEN 600 MG PO TABS
600.0000 mg | ORAL_TABLET | Freq: Four times a day (QID) | ORAL | 1 refills | Status: DC | PRN
Start: 2020-09-10 — End: 2020-11-12

## 2020-09-27 ENCOUNTER — Encounter: Payer: Self-pay | Admitting: Family Medicine

## 2020-09-27 ENCOUNTER — Ambulatory Visit (INDEPENDENT_AMBULATORY_CARE_PROVIDER_SITE_OTHER): Payer: Managed Care, Other (non HMO) | Admitting: Family Medicine

## 2020-09-27 ENCOUNTER — Other Ambulatory Visit (HOSPITAL_COMMUNITY)
Admission: RE | Admit: 2020-09-27 | Discharge: 2020-09-27 | Disposition: A | Discharge status: Home / Self Care | Payer: Managed Care, Other (non HMO) | Source: Ambulatory Visit | Attending: Family Medicine | Admitting: Family Medicine

## 2020-09-27 ENCOUNTER — Other Ambulatory Visit: Payer: Self-pay

## 2020-09-27 VITALS — BP 114/69 | HR 66 | Ht 64.0 in | Wt 169.0 lb

## 2020-09-27 DIAGNOSIS — N939 Abnormal uterine and vaginal bleeding, unspecified: Secondary | ICD-10-CM | POA: Insufficient documentation

## 2020-09-27 DIAGNOSIS — Z01419 Encounter for gynecological examination (general) (routine) without abnormal findings: Secondary | ICD-10-CM

## 2020-09-27 DIAGNOSIS — Z124 Encounter for screening for malignant neoplasm of cervix: Secondary | ICD-10-CM | POA: Diagnosis not present

## 2020-09-27 DIAGNOSIS — Z1231 Encounter for screening mammogram for malignant neoplasm of breast: Secondary | ICD-10-CM

## 2020-09-27 NOTE — Progress Notes (Signed)
Subjective:     Regina Schaefer is a 50 y.o. female and is here for a comprehensive physical exam. The patient reports problems - heavy vaginal bleeding, since stopping OCs.  Had pelvic sonogram which showed right ovarian cysts, which did not need f/u and endometrial thickness of .89 cm. Pelvic cultures were negative.   The following portions of the patient's history were reviewed and updated as appropriate: allergies, current medications, past family history, past medical history, past social history, past surgical history, and problem list.  Review of Systems Pertinent items noted in HPI and remainder of comprehensive ROS otherwise negative.   Objective:  Chaperone present for exam   BP 114/69   Pulse 66   Ht 5\' 4"  (1.626 m)   Wt 169 lb (76.7 kg)   LMP 09/09/2020   BMI 29.01 kg/m  General appearance: alert, cooperative, and appears stated age Head: Normocephalic, without obvious abnormality, atraumatic Neck: no adenopathy, supple, symmetrical, trachea midline, and thyroid not enlarged, symmetric, no tenderness/mass/nodules Lungs: clear to auscultation bilaterally Breasts: normal appearance, no masses or tenderness, bialteral saline implants Heart: regular rate and rhythm, S1, S2 normal, no murmur, click, rub or gallop Abdomen: soft, non-tender; bowel sounds normal; no masses,  no organomegaly Pelvic: cervix normal in appearance, external genitalia normal, no adnexal masses or tenderness, no cervical motion tenderness, uterus normal size, shape, and consistency, and vagina normal without discharge Extremities: extremities normal, atraumatic, no cyanosis or edema Pulses: 2+ and symmetric Skin: Skin color, texture, turgor normal. No rashes or lesions Lymph nodes: Cervical, supraclavicular, and axillary nodes normal. Neurologic: Grossly normal    Procedure: Patient given informed consent, signed copy in the chart, time out was performed. Appropriate time out taken.  The patient was  placed in the lithotomy position and the cervix brought into view with sterile speculum.  Portio of cervix cleansed x 2 with betadine swabs.  A tenaculum was placed in the anterior lip of the cervix.  The uterus was sounded for depth of 8 mm. A pipelle was introduced to into the uterus, suction created,  and an endometrial sample was obtained. All equipment was removed and accounted for.  The patient tolerated the procedure well.   Assessment:    Healthy female exam.      Plan:   Problem List Items Addressed This Visit       Unprioritized   Abnormal uterine bleeding (AUB)    s/p EMB today, f/u based on results.      Relevant Orders   Surgical pathology( [place]/ [facility])   Other Visit Diagnoses     Women's annual routine gynecological examination    -  Primary   had screening colonoscopy at age 76 with [facility]   Screening for malignant neoplasm of cervix       prefers annual pap smears   Relevant Orders   Cytology - PAP( [place])   Encounter for gynecological examination without abnormal finding       Encounter for screening mammogram for malignant neoplasm of breast       Mammogram ordered   Relevant Orders   MM 3D SCREEN BREAST W/IMPLANT BILATERAL      Return in 1 year (on 09/27/2021).    See After Visit Summary for Counseling Recommendations

## 2020-09-27 NOTE — Assessment & Plan Note (Signed)
s/p EMB today, f/u based on results.

## 2020-09-27 NOTE — Progress Notes (Signed)
Patient presents for annual exam.  Is in need of endometrial biopsy from results of imaging done on 09-10-20 Pt needs mammogram scheduled (last mmg on 07-16-19). Armandina Stammer RN

## 2020-09-28 LAB — SURGICAL PATHOLOGY

## 2020-09-29 ENCOUNTER — Ambulatory Visit (INDEPENDENT_AMBULATORY_CARE_PROVIDER_SITE_OTHER): Payer: Managed Care, Other (non HMO) | Admitting: Family Medicine

## 2020-09-29 ENCOUNTER — Other Ambulatory Visit: Payer: Self-pay

## 2020-09-29 ENCOUNTER — Encounter: Payer: Self-pay | Admitting: Family Medicine

## 2020-09-29 ENCOUNTER — Other Ambulatory Visit (HOSPITAL_BASED_OUTPATIENT_CLINIC_OR_DEPARTMENT_OTHER): Payer: Self-pay | Admitting: Family Medicine

## 2020-09-29 DIAGNOSIS — N939 Abnormal uterine and vaginal bleeding, unspecified: Secondary | ICD-10-CM | POA: Diagnosis not present

## 2020-09-29 DIAGNOSIS — Z1231 Encounter for screening mammogram for malignant neoplasm of breast: Secondary | ICD-10-CM

## 2020-09-29 MED ORDER — DOXYCYCLINE HYCLATE 100 MG PO CAPS
100.0000 mg | ORAL_CAPSULE | Freq: Two times a day (BID) | ORAL | 0 refills | Status: DC
Start: 2020-09-29 — End: 2020-12-02

## 2020-09-29 MED ORDER — DICLOFENAC SODIUM 75 MG PO TBEC: 75.0000 mg | DELAYED_RELEASE_TABLET | Freq: Two times a day (BID) | ORAL | 2 refills | Status: DC

## 2020-09-29 MED ORDER — NORETHINDRONE ACETATE 5 MG PO TABS: 5.0000 mg | ORAL_TABLET | Freq: Every day | ORAL | 1 refills | Status: DC

## 2020-09-29 NOTE — Assessment & Plan Note (Addendum)
Trial of Diclofenac and Aygestin. Presumptive treatment with doxycycline for endometritis. Change to diclofenac + heat for pain. Aygestin as needed for heavy bleeding.

## 2020-09-29 NOTE — Progress Notes (Signed)
   Subjective:    Patient ID: Regina Schaefer is a 50 y.o. female presenting with No chief complaint on file.  on 09/29/2020  HPI: S/p EMB for bleeding and cramping on 8/22. She has now developed bleeding and cramping which feels similar to what she was already having. Had previously been on OC's and stopped those.  She has had an essentially normal pelvic sono and EMB.  Review of Systems  Constitutional:  Negative for chills and fever.  Respiratory:  Negative for shortness of breath.   Cardiovascular:  Negative for chest pain.  Gastrointestinal:  Positive for abdominal pain. Negative for nausea and vomiting.  Genitourinary:  Positive for pelvic pain and vaginal bleeding. Negative for dysuria.  Skin:  Negative for rash.     Objective:    BP 104/64   Pulse 67   Wt 168 lb (76.2 kg)   LMP 09/09/2020   BMI 28.84 kg/m  Physical Exam Constitutional:      General: She is not in acute distress.    Appearance: She is well-developed.  HENT:     Head: Normocephalic and atraumatic.  Eyes:     General: No scleral icterus. Cardiovascular:     Rate and Rhythm: Normal rate.  Pulmonary:     Effort: Pulmonary effort is normal.  Abdominal:     Palpations: Abdomen is soft.  Musculoskeletal:     Cervical back: Neck supple.  Skin:    General: Skin is warm and dry.  Neurological:     Mental Status: She is alert and oriented to person, place, and time.        Assessment & Plan:   Problem List Items Addressed This Visit       Unprioritized   Abnormal uterine bleeding (AUB)    Trial of Diclofenac and Aygestin. Presumptive treatment with doxycycline for endometritis. Change to diclofenac + heat for pain. Aygestin as needed for heavy bleeding.       Relevant Medications   doxycycline (VIBRAMYCIN) 100 MG capsule   norethindrone (AYGESTIN) 5 MG tablet   diclofenac (VOLTAREN) 75 MG EC tablet     Return in about 6 weeks (around 11/10/2020).  Reva Bores 09/29/2020 2:10 PM

## 2020-09-30 LAB — CYTOLOGY - PAP
Comment: NEGATIVE
Diagnosis: UNDETERMINED — AB
High risk HPV: NEGATIVE

## 2020-10-07 ENCOUNTER — Ambulatory Visit: Payer: Managed Care, Other (non HMO) | Admitting: Family Medicine

## 2020-10-10 MED ORDER — NORETHINDRONE 0.35 MG PO TABS: 1.0000 | ORAL_TABLET | Freq: Every day | ORAL | 3 refills | Status: DC

## 2020-10-21 ENCOUNTER — Telehealth (INDEPENDENT_AMBULATORY_CARE_PROVIDER_SITE_OTHER): Payer: Managed Care, Other (non HMO) | Admitting: Family Medicine

## 2020-10-21 ENCOUNTER — Encounter: Payer: Self-pay | Admitting: Family Medicine

## 2020-10-21 DIAGNOSIS — N939 Abnormal uterine and vaginal bleeding, unspecified: Secondary | ICD-10-CM

## 2020-10-21 NOTE — Progress Notes (Signed)
GYNECOLOGY VIRTUAL VISIT ENCOUNTER NOTE  Provider location: Center for [facility]   Patient location: Home  I connected with Arizona Constable on 10/21/20 at  8:55 AM EDT by MyChart Video Encounter and verified that I am speaking with the correct person using two identifiers.   I discussed the limitations, risks, security and privacy concerns of performing an evaluation and management service virtually and the availability of in person appointments. I also discussed with the patient that there may be a patient responsible charge related to this service. The patient expressed understanding and agreed to proceed.   History:  Zynasia Burklow is a 50 y.o. [phone] female being evaluated today for abnormal bleeding. Bleeding started post EMB and has not stopped despite aygestin 5. She reports it is quite heavy. She has been bleeding for weeks and it is impacting her life. She is staying home and is afraid to go out due to how much she is bleeding. W/u has been unrevealing with normal EMB and u/s showing small calcified fibroid. She denies any abnormal vaginal discharge, pelvic pain or other concerns.       Past Medical History:  Diagnosis Date   Hyperlipidemia    Past Surgical History:  Procedure Laterality Date   AUGMENTATION MAMMAPLASTY     BACK SURGERY     CESAREAN SECTION     The following portions of the patient's history were reviewed and updated as appropriate: allergies, current medications, past family history, past medical history, past social history, past surgical history and problem list.   Health Maintenance:  ASCUSpap and negative HRHPV on 09/27/20.  Normal mammogram on 08/05/2019.   Review of Systems:  Pertinent items noted in HPI and remainder of comprehensive ROS otherwise negative.  Physical Exam:   General:  Alert, oriented and cooperative. Patient appears to be in no acute distress.  Mental Status: Normal mood and affect. Normal  behavior. Normal judgment and thought content.   Respiratory: Normal respiratory effort, no problems with respiration noted  Rest of physical exam deferred due to type of encounter  Labs and Imaging A. ENDOMETRIUM, BIOPSY:  - Benign secretory endometrium  - Negative for hyperplasia or malignancy    High risk HPV Negative   Adequacy Satisfactory for evaluation; transformation zone component PRESENT.   Diagnosis - Atypical squamous cells of undetermined significance (ASC-US) Abnormal    Comment Normal Reference Range HPV - Negative    Pelvic u/s 1. Endometrial thickness of 8.9 mm. If bleeding remains unresponsive to hormonal or medical therapy, sonohysterogram should be considered for focal lesion work-up. (Ref: Radiological Reasoning: Algorithmic Workup of Abnormal Vaginal Bleeding with Endovaginal Sonography and Sonohysterography. AJR 2008; [id number]) 2. Multiple right ovarian cysts measuring up to 2 cm. No followup imaging recommended.  Assessment and Plan:     Problem List Items Addressed This Visit       Unprioritized   Abnormal uterine bleeding (AUB) - Primary    Discussed possible reasons for bleeding, anovulatory related to peri-menopause. Will increase Aygestin to 10 and up to 15 if needed for a few weeks, with then taper back down. Will check Hgb and TSH given her fatigue and on-going bleeding. Discussed other options to get her bleeding under control, to include IUD, D & C, endometrial ablation, hysterectomy. Possible 10 mcg OCP--given age and risks of estrogen but might help with mood. Discussed stress and impact on health.      Relevant Orders   CBC   TSH  I discussed the assessment and treatment plan with the patient. The patient was provided an opportunity to ask questions and all were answered. The patient agreed with the plan and demonstrated an understanding of the instructions.   The patient was advised to call back or seek an in-person  evaluation/go to the ED if the symptoms worsen or if the condition fails to improve as anticipated.  I provided 20 minutes of face-to-face time during this encounter.   Reva Bores, MD Center for [employer], [facility]

## 2020-10-21 NOTE — Assessment & Plan Note (Signed)
Discussed possible reasons for bleeding, anovulatory related to peri-menopause. Will increase Aygestin to 10 and up to 15 if needed for a few weeks, with then taper back down. Will check Hgb and TSH given her fatigue and on-going bleeding. Discussed other options to get her bleeding under control, to include IUD, D & C, endometrial ablation, hysterectomy. Possible 10 mcg OCP--given age and risks of estrogen but might help with mood. Discussed stress and impact on health.

## 2020-10-22 ENCOUNTER — Other Ambulatory Visit (INDEPENDENT_AMBULATORY_CARE_PROVIDER_SITE_OTHER): Payer: Managed Care, Other (non HMO)

## 2020-10-22 ENCOUNTER — Other Ambulatory Visit: Payer: Self-pay

## 2020-10-22 DIAGNOSIS — N939 Abnormal uterine and vaginal bleeding, unspecified: Secondary | ICD-10-CM

## 2020-10-22 NOTE — Progress Notes (Signed)
Pt presents for CBD and TSH labs. Pt was given lab slip and was sent to the lab. Kostantinos Tallman l Clive Parcel, CMA

## 2020-10-22 NOTE — Progress Notes (Signed)
Chart reviewed. Agree with plan of care.   Venora Maples, MD 10/22/20 1:31 PM

## 2020-10-23 LAB — CBC
Hematocrit: 37 % (ref 34.0–46.6)
Hemoglobin: 12 g/dL (ref 11.1–15.9)
MCH: 29.1 pg (ref 26.6–33.0)
MCHC: 32.4 g/dL (ref 31.5–35.7)
MCV: 90 fL (ref 79–97)
Platelets: 451 10*3/uL — ABNORMAL HIGH (ref 150–450)
RBC: 4.12 x10E6/uL (ref 3.77–5.28)
RDW: 12.5 % (ref 11.7–15.4)
WBC: 11 10*3/uL — ABNORMAL HIGH (ref 3.4–10.8)

## 2020-10-23 LAB — TSH: TSH: 2.21 u[IU]/mL (ref 0.450–4.500)

## 2020-11-02 ENCOUNTER — Ambulatory Visit (HOSPITAL_BASED_OUTPATIENT_CLINIC_OR_DEPARTMENT_OTHER)
Admission: RE | Admit: 2020-11-02 | Discharge: 2020-11-02 | Disposition: A | Discharge status: Home / Self Care | Payer: Managed Care, Other (non HMO) | Source: Ambulatory Visit | Attending: Family Medicine | Admitting: Family Medicine

## 2020-11-02 ENCOUNTER — Other Ambulatory Visit: Payer: Self-pay

## 2020-11-02 ENCOUNTER — Encounter (HOSPITAL_BASED_OUTPATIENT_CLINIC_OR_DEPARTMENT_OTHER): Payer: Self-pay

## 2020-11-02 DIAGNOSIS — Z1231 Encounter for screening mammogram for malignant neoplasm of breast: Secondary | ICD-10-CM | POA: Insufficient documentation

## 2020-11-12 MED ORDER — NORETHINDRONE ACETATE 5 MG PO TABS: 5.0000 mg | ORAL_TABLET | Freq: Two times a day (BID) | ORAL | 2 refills | Status: DC

## 2020-11-12 NOTE — Addendum Note (Signed)
Addended by: Levie Heritage on: 11/12/2020 03:54 PM   Modules accepted: Orders

## 2020-11-17 MED ORDER — NORETHINDRONE ACETATE 5 MG PO TABS: 5.0000 mg | ORAL_TABLET | Freq: Two times a day (BID) | ORAL | 0 refills | Status: DC

## 2020-12-02 ENCOUNTER — Other Ambulatory Visit: Payer: Self-pay

## 2020-12-02 ENCOUNTER — Ambulatory Visit: Payer: Managed Care, Other (non HMO) | Admitting: Family Medicine

## 2020-12-02 ENCOUNTER — Encounter: Payer: Self-pay | Admitting: Family Medicine

## 2020-12-02 VITALS — BP 107/73 | HR 76 | Wt 178.0 lb

## 2020-12-02 DIAGNOSIS — N939 Abnormal uterine and vaginal bleeding, unspecified: Secondary | ICD-10-CM | POA: Diagnosis not present

## 2020-12-02 MED ORDER — MEDROXYPROGESTERONE ACETATE 10 MG PO TABS: 10.0000 mg | ORAL_TABLET | Freq: Every day | ORAL | 1 refills | Status: DC

## 2020-12-02 NOTE — Progress Notes (Signed)
   Subjective:    Patient ID: Regina Schaefer, female    DOB: 12/25/1970, 50 y.o.   MRN: [id number]  HPI Patient seen for follow up of AUB. She had gone 6 months without bleeding, then had heavy vaginal bleeding US showed endometrial thickness of 8.40mm. EMBx was normal. Was placed on aygestin 5mg  and increased to 10mg  daily. Bleeding stopped. Has noticed that she has gained weight (13# since 8/4).   Review of Systems  All other systems reviewed and are negative.      Objective:  BP 107/73   Pulse 76   Wt 178 lb (80.7 kg)   LMP  (LMP Unknown)   BMI 30.55 kg/m   Physical Exam Vitals reviewed.  Constitutional:      Appearance: Normal appearance.  Cardiovascular:     Rate and Rhythm: Normal rate and regular rhythm.  Pulmonary:     Effort: Pulmonary effort is normal.     Breath sounds: Normal breath sounds.  Skin:    Capillary Refill: Capillary refill takes less than 2 seconds.  Neurological:     General: No focal deficit present.     Mental Status: She is alert and oriented to person, place, and time. Mental status is at baseline.  Psychiatric:        Mood and Affect: Mood normal.        Behavior: Behavior normal.        Thought Content: Thought content normal.        Judgment: Judgment normal.       Assessment & Plan:  1. Abnormal uterine bleeding (AUB) Discussed options - IUD, changing progestins, surgical management with either ablation or hysterectomy.  Patient hesitant about surgical management. Open to IUD, but wants to research. Will switch to provera. F/u in 4 weeks.

## 2020-12-16 MED ORDER — OXYCODONE-ACETAMINOPHEN 5-325 MG PO TABS: 1.0000 | ORAL_TABLET | Freq: Four times a day (QID) | ORAL | 0 refills | Status: DC | PRN

## 2020-12-16 NOTE — Addendum Note (Signed)
Addended by: Levie Heritage on: 12/16/2020 12:07 PM   Modules accepted: Orders

## 2020-12-23 ENCOUNTER — Ambulatory Visit: Payer: Managed Care, Other (non HMO) | Admitting: Family Medicine

## 2020-12-23 ENCOUNTER — Encounter: Payer: Self-pay | Admitting: Family Medicine

## 2020-12-23 ENCOUNTER — Encounter: Payer: Self-pay | Admitting: General Practice

## 2020-12-23 ENCOUNTER — Other Ambulatory Visit: Payer: Self-pay

## 2020-12-23 VITALS — BP 108/56 | HR 70 | Wt 170.0 lb

## 2020-12-23 DIAGNOSIS — N939 Abnormal uterine and vaginal bleeding, unspecified: Secondary | ICD-10-CM

## 2020-12-23 DIAGNOSIS — Z30430 Encounter for insertion of intrauterine contraceptive device: Secondary | ICD-10-CM | POA: Diagnosis not present

## 2020-12-23 LAB — POCT URINE PREGNANCY: Preg Test, Ur: NEGATIVE

## 2020-12-23 MED ORDER — LEVONORGESTREL 20.1 MCG/DAY IU IUD
1.0000 | INTRAUTERINE_SYSTEM | Freq: Once | INTRAUTERINE | Status: AC
  Administered 2020-12-23: 14:00:00 1 via INTRAUTERINE

## 2020-12-23 NOTE — Progress Notes (Signed)
IUD Procedure Note Patient identified, informed consent performed, signed copy in chart, time out was performed.  Urine pregnancy test negative.  Speculum placed in the vagina.  Cervix visualized.  Cleaned with Betadine x 2.  Paracervical block placed with Lidocaine 2% with epinephrine 10mL spread between the 12 o'clock, 4 o'clock, 8 o'clock positions. Cervix grasped anteriorly with a single tooth tenaculum.  Uterus sounded to 8 cm.  Liletta  IUD placed per manufacturer's recommendations.  Strings trimmed to 3 cm. Tenaculum was removed, good hemostasis noted.  Patient tolerated procedure well.   Patient given post procedure instructions and Liletta care card with expiration date.  Patient is asked to check IUD strings periodically and follow up in 4-6 weeks for IUD check. 

## 2020-12-23 NOTE — Addendum Note (Signed)
Addended by: Lorelle Gibbs L on: 12/23/2020 01:51 PM   Modules accepted: Orders

## 2021-01-05 ENCOUNTER — Ambulatory Visit: Payer: Managed Care, Other (non HMO) | Admitting: Family Medicine

## 2021-01-20 ENCOUNTER — Ambulatory Visit: Payer: Managed Care, Other (non HMO) | Admitting: Family Medicine

## 2021-01-20 ENCOUNTER — Encounter: Payer: Self-pay | Admitting: Family Medicine

## 2021-01-20 ENCOUNTER — Other Ambulatory Visit: Payer: Self-pay

## 2021-01-20 VITALS — BP 111/76 | HR 74 | Wt 169.0 lb

## 2021-01-20 DIAGNOSIS — N939 Abnormal uterine and vaginal bleeding, unspecified: Secondary | ICD-10-CM | POA: Diagnosis not present

## 2021-01-20 DIAGNOSIS — Z30431 Encounter for routine checking of intrauterine contraceptive device: Secondary | ICD-10-CM | POA: Diagnosis not present

## 2021-01-20 NOTE — Progress Notes (Signed)
° °  Subjective:   Patient Name: Regina Schaefer, female   DOB: 1970/12/12, 50 y.o.  MRN: [id number]  HPI Patient here for an IUD check.  She had the Liletta IUD placed 1 month ago for AUB.  She reports occasional light bleeding and spotting. She has not been taking the provera or aygestin. Denies cramping. Has been having sex - no difficulties.   Review of Systems  Constitutional: Negative for fever and chills.  Gastrointestinal: Negative for abdominal pain.  Genitourinary: Negative for vaginal discharge, vaginal pain, pelvic pain and dyspareunia.        Objective:   Physical Exam  Constitutional: She appears well-developed and well-nourished.  HENT:  Head: Normocephalic and atraumatic.  Abdominal: Soft. There is no tenderness. There is no guarding.  Genitourinary: There is no rash, tenderness or lesion on the right labia. There is no rash, tenderness or lesion on the left labia. No erythema or tenderness in the vagina. No foreign body around the vagina. No signs of injury around the vagina. No vaginal discharge found.    Skin: Skin is warm and dry.  Psychiatric: She has a normal mood and affect. Her behavior is normal. Judgment and thought content normal.       Assessment & Plan:   1. Abnormal uterine bleeding (AUB)   2. IUD check up    IUD in place.  Discussed that spotting and occasional bleeding may taper off over the next few months. May need occasional or very low dose progestin to eliminate breakthrough bleeding. Will follow up in 3 months.

## 2021-01-20 NOTE — Progress Notes (Signed)
Patient presents for IUD string check. Sanchez Hemmer RN 

## 2021-02-05 ENCOUNTER — Encounter: Payer: Self-pay | Admitting: Family Medicine

## 2021-02-09 MED ORDER — TRIAMCINOLONE ACETONIDE 0.5 % EX CREA: 1.0000 | TOPICAL_CREAM | Freq: Three times a day (TID) | CUTANEOUS | 6 refills | Status: DC

## 2021-02-28 ENCOUNTER — Encounter: Payer: Self-pay | Admitting: Family Medicine

## 2021-03-02 ENCOUNTER — Ambulatory Visit (INDEPENDENT_AMBULATORY_CARE_PROVIDER_SITE_OTHER): Payer: Managed Care, Other (non HMO)

## 2021-03-02 ENCOUNTER — Other Ambulatory Visit: Payer: Self-pay

## 2021-03-02 DIAGNOSIS — R300 Dysuria: Secondary | ICD-10-CM

## 2021-03-02 LAB — POCT URINALYSIS DIPSTICK
Bilirubin, UA: NEGATIVE
Glucose, UA: NEGATIVE
Leukocytes, UA: NEGATIVE
Nitrite, UA: POSITIVE
Protein, UA: NEGATIVE
Spec Grav, UA: 1.01 (ref 1.010–1.025)
Urobilinogen, UA: 0.2 E.U./dL
pH, UA: 5 (ref 5.0–8.0)

## 2021-03-02 MED ORDER — NITROFURANTOIN MONOHYD MACRO 100 MG PO CAPS: 100.0000 mg | ORAL_CAPSULE | Freq: Two times a day (BID) | ORAL | 0 refills | Status: DC

## 2021-03-02 NOTE — Progress Notes (Signed)
Patient presents with dysuria for several days. Patient is also having frequency. Kathrene Alu RN

## 2021-03-02 NOTE — Addendum Note (Signed)
Addended by: Anell Barr on: 03/02/2021 02:01 PM   Modules accepted: Orders

## 2021-03-05 LAB — URINE CULTURE

## 2021-03-07 ENCOUNTER — Other Ambulatory Visit: Payer: Self-pay

## 2021-03-07 DIAGNOSIS — N921 Excessive and frequent menstruation with irregular cycle: Secondary | ICD-10-CM

## 2021-03-07 DIAGNOSIS — Z975 Presence of (intrauterine) contraceptive device: Secondary | ICD-10-CM

## 2021-03-07 MED ORDER — MEDROXYPROGESTERONE ACETATE 10 MG PO TABS: 10.0000 mg | ORAL_TABLET | Freq: Every day | ORAL | 0 refills | Status: DC

## 2021-03-07 MED ORDER — MEDROXYPROGESTERONE ACETATE 10 MG PO TABS: 10.0000 mg | ORAL_TABLET | Freq: Two times a day (BID) | ORAL | 0 refills | Status: DC

## 2021-03-07 NOTE — Progress Notes (Signed)
Patient called stating the script for medroxyprogestrone needs directions that she can take two a day as Dr. Adrian Blackwater instructed her. Armandina Stammer RN

## 2021-03-17 ENCOUNTER — Ambulatory Visit: Payer: Managed Care, Other (non HMO) | Admitting: Family Medicine

## 2021-03-17 ENCOUNTER — Encounter: Payer: Self-pay | Admitting: Family Medicine

## 2021-03-17 ENCOUNTER — Other Ambulatory Visit: Payer: Self-pay

## 2021-03-17 VITALS — BP 105/67 | HR 105 | Wt 173.0 lb

## 2021-03-17 DIAGNOSIS — Z30431 Encounter for routine checking of intrauterine contraceptive device: Secondary | ICD-10-CM

## 2021-03-17 DIAGNOSIS — N939 Abnormal uterine and vaginal bleeding, unspecified: Secondary | ICD-10-CM

## 2021-03-17 MED ORDER — NORETHIN-ETH ESTRAD-FE BIPHAS 1 MG-10 MCG / 10 MCG PO TABS: 1.0000 | ORAL_TABLET | Freq: Every day | ORAL | 3 refills | Status: DC

## 2021-03-17 NOTE — Progress Notes (Signed)
° °  Subjective:    Patient ID: Regina Schaefer, female    DOB: 11-Aug-1970, 51 y.o.   MRN: [id number]  HPI  Patient seen for follow-up of AUB.  Patient had IUD placed in November and has had continued breakthrough bleeding since then.  I placed her on Provera and increase the dose so she is taking 10 mg twice a day.  The bleeding has slowly stopped so that she has not had any bleeding over the past couple of days.  In addition to her bleeding, she has had a lot of hot flashes, mood irritability, vaginal dryness.  She is also had some pelvic cramping that is intermittent, but severe at times.  Review of Systems     Objective:   Physical Exam Vitals reviewed.  Constitutional:      Appearance: Normal appearance.  Cardiovascular:     Rate and Rhythm: Normal rate and regular rhythm.  Pulmonary:     Effort: Pulmonary effort is normal.     Breath sounds: Normal breath sounds.  Skin:    Capillary Refill: Capillary refill takes less than 2 seconds.  Neurological:     General: No focal deficit present.     Mental Status: She is alert.  Psychiatric:        Mood and Affect: Mood normal.        Behavior: Behavior normal.        Thought Content: Thought content normal.        Judgment: Judgment normal.      Assessment & Plan:   1. Abnormal uterine bleeding (AUB)   2. IUD check up   Will change from Provera to Lo Loestrin due to age and risk factors.  It does appear that her insurance may not cover this, so we will give her sample back for 2 months as well as a discount card.  We will have her follow-up in a couple months to see whether this is effective.  We also discussed adding Wellbutrin to counteract some of the weight gain and mood changes.  This can be added at any point.  Hopefully the low Loestrin will help with the breakthrough bleeding and perimenopausal symptoms.

## 2021-03-24 LAB — THYROID PANEL WITH TSH
Free Thyroxine Index: 1.9 (ref 1.2–4.9)
T3 Uptake Ratio: 30 % (ref 24–39)
T4, Total: 6.4 ug/dL (ref 4.5–12.0)
TSH: 2.1 u[IU]/mL (ref 0.450–4.500)

## 2021-03-24 LAB — ESTROGENS, TOTAL: Estrogen: 57 pg/mL

## 2021-03-24 LAB — FOLLICLE STIMULATING HORMONE: FSH: 69 m[IU]/mL

## 2021-04-01 ENCOUNTER — Encounter: Payer: Self-pay | Admitting: Family Medicine

## 2021-04-13 MED ORDER — NORETHIN-ETH ESTRAD-FE BIPHAS 1 MG-10 MCG / 10 MCG PO TABS: 1.0000 | ORAL_TABLET | Freq: Every day | ORAL | 3 refills | Status: DC

## 2021-05-01 ENCOUNTER — Encounter: Payer: Self-pay | Admitting: Family Medicine

## 2021-05-12 ENCOUNTER — Encounter: Payer: Self-pay | Admitting: Family Medicine

## 2021-05-12 ENCOUNTER — Ambulatory Visit: Payer: Managed Care, Other (non HMO) | Admitting: Family Medicine

## 2021-05-12 VITALS — BP 97/71 | HR 87 | Wt 174.0 lb

## 2021-05-12 DIAGNOSIS — N939 Abnormal uterine and vaginal bleeding, unspecified: Secondary | ICD-10-CM

## 2021-05-12 NOTE — Progress Notes (Signed)
? ?  Subjective:  ? ? Patient ID: Regina Schaefer, female    DOB: 1970-08-21, 51 y.o.   MRN: [id number] ? ?HPI ? ?Patient seen for seen for follow-up of AUB.  The patient has an IUD in place.  She was started on low Loestrin due to continued bleeding.  For the first 2 months, this worked well, however she has been having heavier bleeding since then.  On 3 6, the patient started having heavy bleeding on the nonhormonal days of the pack which continued until the eighth.  The period became lighter over the next week.  It then became heavier on 3/21 through 3 3/23 and then has been spotting since then.  She has continued on the birth control pills, skipping the nonhormonal days this past pack and started a new pack 2 days ago.  She has been having some lighter spotting, which appears to be improving. ? ?Review of Systems ? ?   ?Objective:  ? Physical Exam ?Vitals and nursing note reviewed.  ?Constitutional:   ?   Appearance: Normal appearance.  ?Cardiovascular:  ?   Rate and Rhythm: Normal rate and regular rhythm.  ?Pulmonary:  ?   Effort: Pulmonary effort is normal.  ?Neurological:  ?   Mental Status: She is alert.  ?Psychiatric:     ?   Mood and Affect: Mood normal.     ?   Behavior: Behavior normal.     ?   Thought Content: Thought content normal.     ?   Judgment: Judgment normal.  ? ?   ?Assessment & Plan:  ?1. Abnormal uterine bleeding (AUB) ?We will recheck ultrasound.  Last ultrasound was in August.  She is also had a endometrial biopsy, trial of progesterone only medications. ?I did talk to her about potentially trying an estrogen patch to see if that is improved control versus the OCPs.  At this point, the patient would like to continue with the low Loestrin to see if the bleeding gets under control this month.  We will skip nonhormonal days and take continuously to see if this suppresses the bleeding.  Follow-up in 2 to 3 months ?- US PELVIS TRANSVAGINAL NON-OB (TV ONLY); Future ? ?

## 2021-05-16 ENCOUNTER — Ambulatory Visit (HOSPITAL_BASED_OUTPATIENT_CLINIC_OR_DEPARTMENT_OTHER)
Admission: RE | Admit: 2021-05-16 | Discharge: 2021-05-16 | Disposition: A | Discharge status: Home / Self Care | Payer: Managed Care, Other (non HMO) | Source: Ambulatory Visit | Attending: Family Medicine | Admitting: Family Medicine

## 2021-05-16 DIAGNOSIS — N939 Abnormal uterine and vaginal bleeding, unspecified: Secondary | ICD-10-CM | POA: Diagnosis present

## 2021-06-02 ENCOUNTER — Encounter: Payer: Self-pay | Admitting: Family Medicine

## 2021-06-29 NOTE — Progress Notes (Signed)
GYNECOLOGY OFFICE VISIT NOTE  History:   Regina Schaefer is a 51 y.o. [phone] here today for consultation regarding endometrial ablation regarding her AUB. She has had this ongoing for several months. She had been on OCPs and been well controlled on those. She then stopped them and went 6 months without a period until she presented in August. She had an [place] and EMB during that time. EMB was negative for dysplasia. [place] did not provide explanation for her bleeding - she was noted to have fibroids but they are small and intramural and subserosal.   She followed up shortly thereafter and did a short course of doxycycline for possible endometritis along with aygestin and voltaren. She did not find relief with these measures. The Aygestin was increased and her bleeding stopped but she noticed weight gain during that time. For this reason she was switched to Provera and counseled regarding other options including surgical options. Then in November, she had an LNG-IUD 52mg  placed. She had some BTB during that time and was titrated on Provera for this.   In February she returned to the office - she also developed symptoms of menopause I.e. vaginal dryness, hot flashes and mood swings.  She was started on Wellbutrin and switched to Lo Loestrin. Her [place] at this time was 7.   She had an [place] again in April which was essentially unchanged from August in the sense that it does not explain her bleeding.  In May, she has had continued BTB on the IUD and with Lo Loestrin.      Past Medical History:  Diagnosis Date   Hyperlipidemia     Past Surgical History:  Procedure Laterality Date   AUGMENTATION MAMMAPLASTY Bilateral    BACK SURGERY     CESAREAN SECTION      The following portions of the patient's history were reviewed and updated as appropriate: allergies, current medications, past family history, past medical history, past social history, past surgical history and problem list.   Health Maintenance:    Diagnosis  Date Value Ref Range Status  09/27/2020 (A)  Final   - Atypical squamous cells of undetermined significance (ASC-US)   HPV negative at this time.   Normal mammogram on 10/2020.   Review of Systems:  Pertinent items noted in HPI and remainder of comprehensive ROS otherwise negative.  Physical Exam:  BP 107/64   Pulse 75   Wt 180 lb (81.6 kg)   BMI 30.90 kg/m  CONSTITUTIONAL: Well-developed, well-nourished female in no acute distress.  HEENT:  Normocephalic, atraumatic. External right and left ear normal. No scleral icterus.  NECK: Normal range of motion, supple, no masses noted on observation SKIN: No rash noted. Not diaphoretic. No erythema. No pallor. MUSCULOSKELETAL: Normal range of motion. No edema noted. NEUROLOGIC: Alert and oriented to person, place, and time. Normal muscle tone coordination. No cranial nerve deficit noted. PSYCHIATRIC: Normal mood and affect. Normal behavior. Normal judgment and thought content.    PELVIC: Deferred  Labs and Imaging No results found for this or any previous visit (from the past 168 hour(s)). No results found.  Assessment and Plan:   1. Abnormal uterine bleeding (AUB) - Discussed options available I.e. hormonal therapy, surgery and expectant management - Reviewed perimenopausal - Discussed risks of endometrial ablation. Reviewed would avoid hysterectomy as a last resort. She would like to avoid all surgery until other options exhausted. Reviewed EMB prior to ablation vs in the OR. Reviewed pros/cons of each mainly diagnosis of endometrial  cancer after ablation is possible (although unlikely - negative EMB in august and IUD since that time).  - Discussed hormonal therapy options to try: Junel 1/20 + Mirena Junel 1.5/30 + Mirena Estradiol 1 mg + Mirena Estradiol 2 mg + Mirena Each of these options combined with lysteda for aid in BTB Removal of IUD and come off hormones prior to ablation to see if postmenopausal by then.  Reviewed at that time may have hot flashes so we would discuss HRT prior to IUD removal so she would know all options.  - Discussed if all these fail, would consider ablation at that time (although does not have to go through all the options!). She would have exhausted most if not all options at this point. We discussed she could have ablation with IUD replaced after ablation as well for highest rate of amenorrhea until postmenopausal.  - She will give each step 2 months and if working, then we will [facility] full [facility]. If not working, she will send a Pharmacist, community message to me so we can advance to the next step.  -     norethindrone-ethinyl estradiol-FE (JUNEL FE 1/20) 1-20 MG-MCG tablet; Take 1 tablet by mouth daily. -     tranexamic acid (LYSTEDA) 650 MG TABS tablet; Take 2 tablets (1,300 mg total) by mouth 3 (three) times daily. Take during menses for a maximum of five days    Routine preventative health maintenance measures emphasized. Please refer to After Visit Summary for other counseling recommendations.   Return if symptoms worsen or fail to improve.  Radene Gunning, MD, [facility], [place]

## 2021-07-06 ENCOUNTER — Ambulatory Visit: Payer: Managed Care, Other (non HMO) | Admitting: Obstetrics and Gynecology

## 2021-07-06 ENCOUNTER — Encounter: Payer: Self-pay | Admitting: Obstetrics and Gynecology

## 2021-07-06 VITALS — BP 107/64 | HR 75 | Wt 180.0 lb

## 2021-07-06 DIAGNOSIS — N939 Abnormal uterine and vaginal bleeding, unspecified: Secondary | ICD-10-CM | POA: Diagnosis not present

## 2021-07-06 MED ORDER — TRANEXAMIC ACID 650 MG PO TABS: 1300.0000 mg | ORAL_TABLET | Freq: Three times a day (TID) | ORAL | 2 refills | Status: AC

## 2021-07-06 MED ORDER — NORETHIN ACE-ETH ESTRAD-FE 1-20 MG-MCG PO TABS: 1.0000 | ORAL_TABLET | Freq: Every day | ORAL | 0 refills | Status: DC

## 2021-07-14 ENCOUNTER — Ambulatory Visit: Payer: Managed Care, Other (non HMO) | Admitting: Family Medicine

## 2021-07-19 ENCOUNTER — Encounter: Payer: Self-pay | Admitting: Obstetrics and Gynecology

## 2021-07-19 DIAGNOSIS — N939 Abnormal uterine and vaginal bleeding, unspecified: Secondary | ICD-10-CM

## 2021-07-25 MED ORDER — NORETHINDRONE ACET-ETHINYL EST 1-20 MG-MCG PO TABS: 1.0000 | ORAL_TABLET | Freq: Every day | ORAL | 3 refills | Status: DC

## 2021-08-04 ENCOUNTER — Encounter: Payer: Self-pay | Admitting: Family Medicine

## 2021-08-04 ENCOUNTER — Ambulatory Visit: Payer: Managed Care, Other (non HMO) | Admitting: Family Medicine

## 2021-08-04 VITALS — BP 102/58 | HR 68 | Wt 176.0 lb

## 2021-08-04 DIAGNOSIS — N898 Other specified noninflammatory disorders of vagina: Secondary | ICD-10-CM | POA: Diagnosis not present

## 2021-08-04 MED ORDER — CLOTRIMAZOLE-BETAMETHASONE 1-0.05 % EX CREA: 1.0000 | TOPICAL_CREAM | Freq: Two times a day (BID) | CUTANEOUS | 0 refills | Status: DC

## 2021-08-04 NOTE — Progress Notes (Signed)
   Subjective:    Patient ID: Regina Schaefer, female    DOB: 1970-07-27, 51 y.o.   MRN: [id number]  HPI Patient seen for vaginal introitus.  Patient started about a week and a half ago during sex.  She had some mild burning pain at the lower introitus.  She noticed that she continued to have the pain when she used the bathroom wipes, as well as during a later sexual encounter.  Pain is there mainly when there is contact with the area.   Review of Systems     Objective:   Physical Exam Vitals reviewed. Exam conducted with a chaperone present.  Constitutional:      Appearance: Normal appearance.  Genitourinary:   Skin:    Capillary Refill: Capillary refill takes less than 2 seconds.  Neurological:     General: No focal deficit present.     Mental Status: She is alert.  Psychiatric:        Mood and Affect: Mood normal.        Behavior: Behavior normal.        Thought Content: Thought content normal.       Assessment & Plan:  1. Vaginal irritation Will use lotrisone over the next 10 days. F/u if not improving within 5 days.

## 2021-09-27 ENCOUNTER — Encounter: Payer: Self-pay | Admitting: Obstetrics and Gynecology

## 2021-09-27 ENCOUNTER — Other Ambulatory Visit: Payer: Self-pay

## 2021-09-27 MED ORDER — NORETHINDRONE ACET-ETHINYL EST 1.5-30 MG-MCG PO TABS: 1.0000 | ORAL_TABLET | Freq: Every day | ORAL | 3 refills | Status: DC

## 2021-10-24 ENCOUNTER — Other Ambulatory Visit (HOSPITAL_BASED_OUTPATIENT_CLINIC_OR_DEPARTMENT_OTHER): Payer: Self-pay | Admitting: Family Medicine

## 2021-10-24 DIAGNOSIS — Z1231 Encounter for screening mammogram for malignant neoplasm of breast: Secondary | ICD-10-CM

## 2021-11-07 ENCOUNTER — Inpatient Hospital Stay (HOSPITAL_BASED_OUTPATIENT_CLINIC_OR_DEPARTMENT_OTHER): Admission: RE | Admit: 2021-11-07 | Payer: Managed Care, Other (non HMO) | Source: Ambulatory Visit

## 2021-11-12 ENCOUNTER — Encounter: Payer: Self-pay | Admitting: Obstetrics and Gynecology

## 2021-11-12 DIAGNOSIS — N939 Abnormal uterine and vaginal bleeding, unspecified: Secondary | ICD-10-CM

## 2021-11-16 ENCOUNTER — Ambulatory Visit (HOSPITAL_BASED_OUTPATIENT_CLINIC_OR_DEPARTMENT_OTHER): Payer: Managed Care, Other (non HMO)

## 2021-11-24 ENCOUNTER — Telehealth (HOSPITAL_BASED_OUTPATIENT_CLINIC_OR_DEPARTMENT_OTHER): Payer: Self-pay

## 2021-12-06 MED ORDER — NORETHINDRONE ACET-ETHINYL EST 1.5-30 MG-MCG PO TABS: 1.0000 | ORAL_TABLET | Freq: Every day | ORAL | 3 refills | Status: DC

## 2021-12-27 ENCOUNTER — Ambulatory Visit: Payer: Managed Care, Other (non HMO)

## 2021-12-27 ENCOUNTER — Other Ambulatory Visit (HOSPITAL_COMMUNITY)
Admission: RE | Admit: 2021-12-27 | Discharge: 2021-12-27 | Disposition: A | Discharge status: Home / Self Care | Payer: Managed Care, Other (non HMO) | Source: Ambulatory Visit | Attending: Advanced Practice Midwife | Admitting: Advanced Practice Midwife

## 2021-12-27 VITALS — BP 106/53 | HR 75 | Wt 181.0 lb

## 2021-12-27 DIAGNOSIS — N898 Other specified noninflammatory disorders of vagina: Secondary | ICD-10-CM | POA: Diagnosis present

## 2021-12-27 NOTE — Progress Notes (Signed)
SUBJECTIVE:  51 y.o. female complains of white vaginal discharge for 2 day(s). Denies abnormal vaginal bleeding or significant pelvic pain or fever. No UTI symptoms. Denies history of known exposure to STD.  No LMP recorded. (Menstrual status: IUD).  OBJECTIVE:  She appears well, afebrile. Urine dipstick: not done.  ASSESSMENT:  Vaginal Discharge     PLAN:  BVAG, CVAG probe sent to lab. Treatment: To be determined once lab results are received ROV prn if symptoms persist or worsen.

## 2021-12-29 LAB — CERVICOVAGINAL ANCILLARY ONLY
Bacterial Vaginitis (gardnerella): NEGATIVE
Candida Glabrata: NEGATIVE
Candida Vaginitis: POSITIVE — AB
Comment: NEGATIVE
Comment: NEGATIVE
Comment: NEGATIVE

## 2021-12-30 ENCOUNTER — Encounter: Payer: Self-pay | Admitting: Family Medicine

## 2022-01-02 ENCOUNTER — Other Ambulatory Visit: Payer: Self-pay

## 2022-01-02 DIAGNOSIS — B379 Candidiasis, unspecified: Secondary | ICD-10-CM

## 2022-01-02 MED ORDER — FLUCONAZOLE 150 MG PO TABS: ORAL_TABLET | ORAL | 1 refills | Status: DC

## 2022-01-02 NOTE — Progress Notes (Signed)
Patient tested positive for a yeast infection. Diflucan 150 mg 1 tablet PO once was sent to her pharmacy. A Mychart message will be sent to the patient. Kaitlin Alcindor l Samyrah Bruster, CMA

## 2022-01-11 ENCOUNTER — Encounter: Payer: Self-pay | Admitting: Family Medicine

## 2022-01-11 ENCOUNTER — Ambulatory Visit (INDEPENDENT_AMBULATORY_CARE_PROVIDER_SITE_OTHER): Payer: Managed Care, Other (non HMO) | Admitting: Family Medicine

## 2022-01-11 VITALS — BP 119/70 | HR 76 | Ht 64.0 in | Wt 176.0 lb

## 2022-01-11 DIAGNOSIS — Z30431 Encounter for routine checking of intrauterine contraceptive device: Secondary | ICD-10-CM

## 2022-01-11 DIAGNOSIS — Z01419 Encounter for gynecological examination (general) (routine) without abnormal findings: Secondary | ICD-10-CM

## 2022-01-11 NOTE — Progress Notes (Signed)
   ANNUAL EXAM Patient name: Regina Schaefer MRN [id number]  Date of birth: 30-Nov-1970 Chief Complaint:   Annual Exam  History of Present Illness:   Regina Schaefer is a 51 y.o.  [phone]  female  being seen today for a routine annual exam.  Current complaints: none. She is on COC and liletta. No vaginal bleeding.  No LMP recorded. (Menstrual status: IUD).    Last pap 2022. Results were:  ASCUS neg HPV .  Last mammogram: 10/2020. Results were: normal.  Last colonoscopy: up to date      No data to display               No data to display           Review of Systems:   Pertinent items are noted in HPI Denies any headaches, blurred vision, fatigue, shortness of breath, chest pain, abdominal pain, abnormal vaginal discharge/itching/odor/irritation, problems with periods, bowel movements, urination, or intercourse unless otherwise stated above. Pertinent History Reviewed:  Reviewed past medical,surgical, social and family history.  Reviewed problem list, medications and allergies. Physical Assessment:   Vitals:   01/11/22 1537  BP: 119/70  Pulse: 76  Weight: 176 lb (79.8 kg)  Height: 5\' 4"  (1.626 m)  Body mass index is 30.21 kg/m.        Physical Examination:   General appearance - well appearing, and in no distress  Mental status - alert, oriented to person, place, and time  Psych:  She has a normal mood and affect  Skin - warm and dry, normal color, no suspicious lesions noted  Chest - effort normal, all lung fields clear to auscultation bilaterally  Heart - normal rate and regular rhythm  Neck:  midline trachea, no thyromegaly or nodules  Breasts - breasts appear normal, no suspicious masses, no skin or nipple changes or axillary nodes  Abdomen - soft, nontender, nondistended, no masses or organomegaly  Pelvic - VULVA: normal appearing vulva with no masses, tenderness or lesions  VAGINA: normal appearing vagina with normal color and discharge, no lesions  CERVIX:  normal appearing cervix without discharge or lesions, no CMT  Thin prep pap is not done HR HPV cotesting  IUD strings seen  UTERUS: uterus is felt to be normal size, shape, consistency and nontender   ADNEXA: No adnexal masses or tenderness noted.  Extremities:  No swelling or varicosities noted  Chaperone present for exam  Assessment & Plan:  1. Well woman exam with routine gynecological exam Continue COC  2. IUD check up IUD strings seen    No orders of the defined types were placed in this encounter.   Meds: No orders of the defined types were placed in this encounter.   Follow-up: No follow-ups on file.  , DO 01/11/2022 4:53 PM

## 2022-01-31 ENCOUNTER — Encounter (HOSPITAL_BASED_OUTPATIENT_CLINIC_OR_DEPARTMENT_OTHER): Payer: Self-pay

## 2022-01-31 ENCOUNTER — Ambulatory Visit (HOSPITAL_BASED_OUTPATIENT_CLINIC_OR_DEPARTMENT_OTHER)
Admission: RE | Admit: 2022-01-31 | Discharge: 2022-01-31 | Disposition: A | Discharge status: Home / Self Care | Payer: Managed Care, Other (non HMO) | Source: Ambulatory Visit | Attending: Family Medicine | Admitting: Family Medicine

## 2022-01-31 DIAGNOSIS — Z1231 Encounter for screening mammogram for malignant neoplasm of breast: Secondary | ICD-10-CM | POA: Diagnosis present

## 2022-02-16 ENCOUNTER — Ambulatory Visit: Payer: Managed Care, Other (non HMO)

## 2022-02-16 ENCOUNTER — Other Ambulatory Visit (HOSPITAL_COMMUNITY)
Admission: RE | Admit: 2022-02-16 | Discharge: 2022-02-16 | Disposition: A | Discharge status: Home / Self Care | Payer: Managed Care, Other (non HMO) | Source: Ambulatory Visit | Attending: Family Medicine | Admitting: Family Medicine

## 2022-02-16 VITALS — BP 113/55 | HR 69 | Wt 181.0 lb

## 2022-02-16 DIAGNOSIS — N898 Other specified noninflammatory disorders of vagina: Secondary | ICD-10-CM | POA: Diagnosis not present

## 2022-02-16 DIAGNOSIS — B379 Candidiasis, unspecified: Secondary | ICD-10-CM

## 2022-02-16 MED ORDER — FLUCONAZOLE 150 MG PO TABS: 150.0000 mg | ORAL_TABLET | Freq: Once | ORAL | 1 refills | Status: AC

## 2022-02-16 NOTE — Progress Notes (Signed)
SUBJECTIVE:  52 y.o. female complains of white vaginal discharge for "several" day(s). Denies abnormal vaginal bleeding or significant pelvic pain or fever. No UTI symptoms. Denies history of known exposure to STD.  No LMP recorded. (Menstrual status: IUD).  OBJECTIVE:  She appears well, afebrile.   ASSESSMENT:  Vaginal Discharge  And burning   PLAN:   GC CHL,BVAG, CVAG probe sent to lab. Treatment: To be determined once lab results are received ROV prn if symptoms persist or worsen.

## 2022-02-19 LAB — CERVICOVAGINAL ANCILLARY ONLY
Bacterial Vaginitis (gardnerella): NEGATIVE
Candida Glabrata: NEGATIVE
Candida Vaginitis: POSITIVE — AB
Chlamydia: NEGATIVE
Comment: NEGATIVE
Comment: NEGATIVE
Comment: NEGATIVE
Comment: NEGATIVE
Comment: NORMAL
Neisseria Gonorrhea: NEGATIVE

## 2022-02-22 MED ORDER — FLUCONAZOLE 150 MG PO TABS: ORAL_TABLET | ORAL | 1 refills | Status: DC

## 2022-02-22 NOTE — Addendum Note (Signed)
Addended by: Truett Mainland on: 02/22/2022 08:25 AM   Modules accepted: Orders

## 2022-04-05 ENCOUNTER — Other Ambulatory Visit: Payer: Self-pay

## 2022-04-05 DIAGNOSIS — B379 Candidiasis, unspecified: Secondary | ICD-10-CM

## 2022-04-05 MED ORDER — FLUCONAZOLE 150 MG PO TABS: ORAL_TABLET | ORAL | 0 refills | Status: DC

## 2022-04-05 NOTE — Progress Notes (Signed)
Patient called requesting a Rx for a yeast infection. Diflucan 150 mg PO once was sent to her pharmacy. Robyn Galati l Ami Mally, CMA

## 2022-04-19 ENCOUNTER — Ambulatory Visit: Payer: Managed Care, Other (non HMO) | Admitting: Family Medicine

## 2022-04-19 ENCOUNTER — Encounter: Payer: Self-pay | Admitting: Family Medicine

## 2022-04-19 ENCOUNTER — Other Ambulatory Visit (HOSPITAL_COMMUNITY)
Admission: RE | Admit: 2022-04-19 | Discharge: 2022-04-19 | Disposition: A | Discharge status: Home / Self Care | Payer: Managed Care, Other (non HMO) | Source: Ambulatory Visit | Attending: Family Medicine | Admitting: Family Medicine

## 2022-04-19 VITALS — BP 114/53 | HR 70 | Ht 64.0 in | Wt 179.0 lb

## 2022-04-19 DIAGNOSIS — N898 Other specified noninflammatory disorders of vagina: Secondary | ICD-10-CM

## 2022-04-19 DIAGNOSIS — B379 Candidiasis, unspecified: Secondary | ICD-10-CM

## 2022-04-19 DIAGNOSIS — Z30431 Encounter for routine checking of intrauterine contraceptive device: Secondary | ICD-10-CM

## 2022-04-19 DIAGNOSIS — T8332XA Displacement of intrauterine contraceptive device, initial encounter: Secondary | ICD-10-CM

## 2022-04-19 NOTE — Progress Notes (Unsigned)
Patient presents for recurrent yeast infections. Kathrene Alu RN

## 2022-04-19 NOTE — Progress Notes (Signed)
   Subjective:    Patient ID: Regina Schaefer, female    DOB: 25-Jul-1970, 52 y.o.   MRN: [id number]  HPI  Patient seen for recurrent yeast infections.  Patient is getting yeast infection about 1 or 2 times a month for the past several months.  Her partner is a circumcised female who drives a truck and has gone several days of the week.  When he returns, they have sex several times a day for several days in a row.  Her partner has a vasectomy and due to her birth control, her partner does not wear a condom.  Additional to this, she does have hot flashes, night sweats.  She has been taking her birth control, skipping the nonhormonal week and starting a new pack for continuous birth control.  Have a couple days of spotting towards the end of the pack.  Review of Systems     Objective:   Physical Exam Vitals reviewed.  Constitutional:      Appearance: Normal appearance.  Neurological:     General: No focal deficit present.     Mental Status: She is alert.  Psychiatric:        Mood and Affect: Mood normal.        Behavior: Behavior normal.        Thought Content: Thought content normal.        Judgment: Judgment normal.          Assessment & Plan:  1. Yeast infection Culture today. Discussed that semen can cause changes to vaginal. Using condoms would be the best solution - however patient's partner unlike to wear them. Will need to re-acidify vagina after sex, especially if having sex multiple days in a row. Discussed boric acid use nightly after sex.  2. Vaginal discharge - Cervicovaginal ancillary only( [place])  3. Intrauterine contraceptive device threads lost, initial encounter No IUD strings seen. Check [place]. - US Transvaginal Non-OB; Future

## 2022-04-20 LAB — CERVICOVAGINAL ANCILLARY ONLY
Bacterial Vaginitis (gardnerella): NEGATIVE
Candida Glabrata: NEGATIVE
Candida Vaginitis: NEGATIVE
Chlamydia: NEGATIVE
Comment: NEGATIVE
Comment: NEGATIVE
Comment: NEGATIVE
Comment: NEGATIVE
Comment: NEGATIVE
Comment: NORMAL
Neisseria Gonorrhea: NEGATIVE
Trichomonas: NEGATIVE

## 2022-04-22 ENCOUNTER — Ambulatory Visit (HOSPITAL_BASED_OUTPATIENT_CLINIC_OR_DEPARTMENT_OTHER): Payer: Managed Care, Other (non HMO)

## 2022-04-23 ENCOUNTER — Other Ambulatory Visit: Payer: Self-pay | Admitting: Family Medicine

## 2022-04-23 ENCOUNTER — Ambulatory Visit (HOSPITAL_BASED_OUTPATIENT_CLINIC_OR_DEPARTMENT_OTHER)
Admission: RE | Admit: 2022-04-23 | Discharge: 2022-04-23 | Disposition: A | Discharge status: Home / Self Care | Payer: Managed Care, Other (non HMO) | Source: Ambulatory Visit | Attending: Family Medicine | Admitting: Family Medicine

## 2022-04-23 DIAGNOSIS — T8332XA Displacement of intrauterine contraceptive device, initial encounter: Secondary | ICD-10-CM | POA: Diagnosis present

## 2022-04-23 DIAGNOSIS — N898 Other specified noninflammatory disorders of vagina: Secondary | ICD-10-CM

## 2022-04-23 DIAGNOSIS — B379 Candidiasis, unspecified: Secondary | ICD-10-CM

## 2022-04-26 ENCOUNTER — Ambulatory Visit (HOSPITAL_BASED_OUTPATIENT_CLINIC_OR_DEPARTMENT_OTHER): Payer: Managed Care, Other (non HMO)

## 2022-05-26 ENCOUNTER — Other Ambulatory Visit: Payer: Self-pay

## 2022-05-26 DIAGNOSIS — N939 Abnormal uterine and vaginal bleeding, unspecified: Secondary | ICD-10-CM

## 2022-05-26 MED ORDER — NORETHINDRONE ACET-ETHINYL EST 1.5-30 MG-MCG PO TABS: 1.0000 | ORAL_TABLET | Freq: Every day | ORAL | 3 refills | Status: DC

## 2022-06-02 ENCOUNTER — Encounter: Payer: Self-pay | Admitting: Family Medicine

## 2022-06-05 ENCOUNTER — Other Ambulatory Visit: Payer: Self-pay

## 2022-06-05 DIAGNOSIS — B379 Candidiasis, unspecified: Secondary | ICD-10-CM

## 2022-06-05 MED ORDER — FLUCONAZOLE 150 MG PO TABS: ORAL_TABLET | ORAL | 1 refills | Status: DC

## 2022-06-05 NOTE — Progress Notes (Unsigned)
Patient sent Mychart message requesting a Rx for a yeast infection. Diflucan 150 mg was sent to her pharmacy.  Regina Schaefer, CMA

## 2022-07-19 ENCOUNTER — Encounter: Payer: Self-pay | Admitting: Family Medicine

## 2022-07-19 ENCOUNTER — Other Ambulatory Visit: Payer: Self-pay

## 2022-07-19 MED ORDER — FLUCONAZOLE 150 MG PO TABS: 150.0000 mg | ORAL_TABLET | Freq: Once | ORAL | 0 refills | Status: AC

## 2022-07-26 ENCOUNTER — Other Ambulatory Visit: Payer: Self-pay | Admitting: Family Medicine

## 2022-07-26 DIAGNOSIS — B379 Candidiasis, unspecified: Secondary | ICD-10-CM

## 2022-07-26 MED ORDER — FLUCONAZOLE 150 MG PO TABS: ORAL_TABLET | ORAL | 0 refills | Status: DC

## 2022-08-07 ENCOUNTER — Other Ambulatory Visit: Payer: Self-pay

## 2022-08-07 DIAGNOSIS — N939 Abnormal uterine and vaginal bleeding, unspecified: Secondary | ICD-10-CM

## 2022-08-07 MED ORDER — NORETHINDRONE ACET-ETHINYL EST 1.5-30 MG-MCG PO TABS
1.0000 | ORAL_TABLET | Freq: Every day | ORAL | 3 refills | Status: DC
Start: 2022-08-07 — End: 2023-08-15

## 2022-08-31 IMAGING — MG DIGITAL SCREENING BREAST BILAT IMPLANT W/ TOMO W/ CAD
8 of 12 series · 8 of 28 positions shown · non-contrast
Comparison: Previous exam(s).

CLINICAL DATA: Screening.

EXAM:
DIGITAL SCREENING BILATERAL MAMMOGRAM WITH IMPLANTS, CAD AND
TOMOSYNTHESIS
TECHNIQUE: Bilateral screening digital craniocaudal and mediolateral oblique
mammograms were obtained. Bilateral screening digital breast
tomosynthesis was performed. The images were evaluated with
computer-aided detection. Standard and/or implant displaced views
were performed.

[R CC]
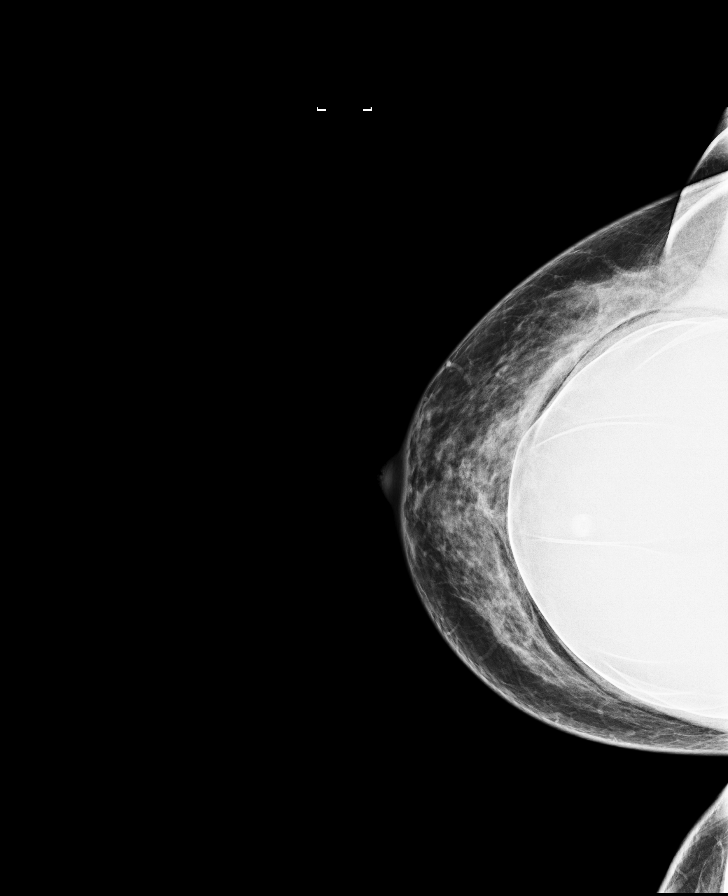

[L MLO]
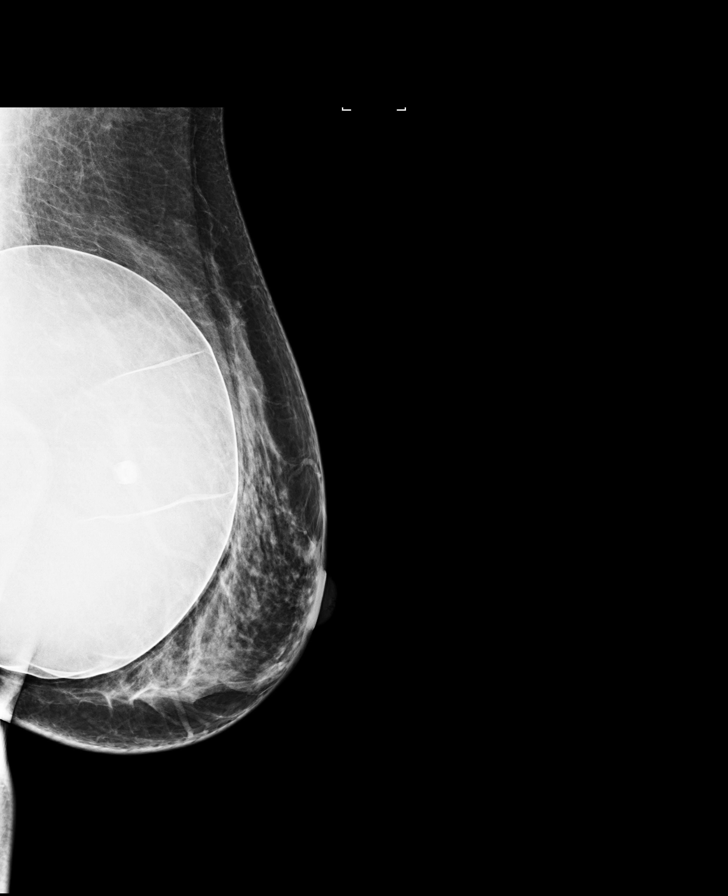

[R MLO]
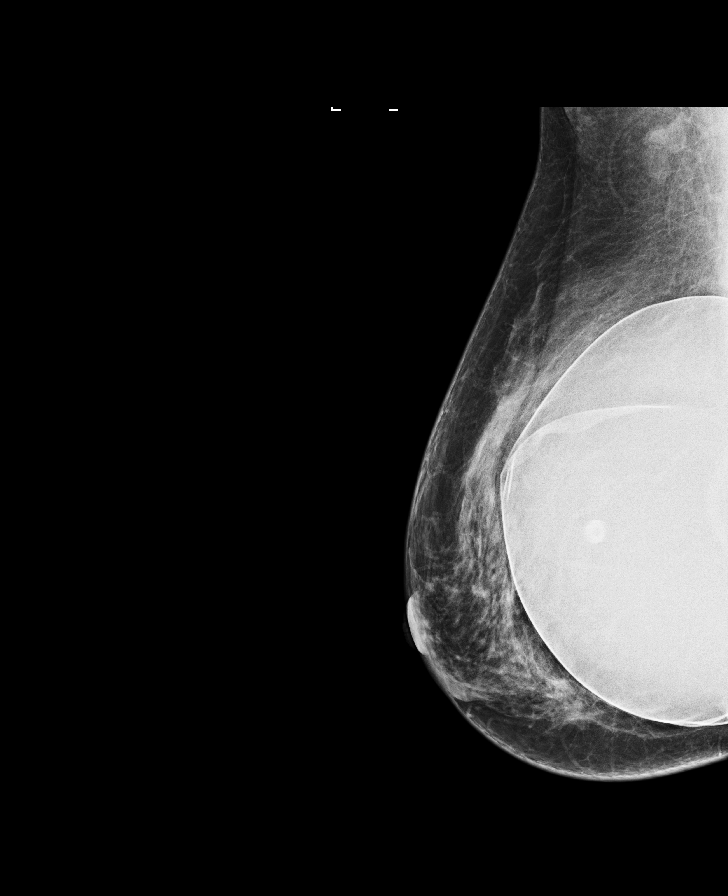

[L CC]
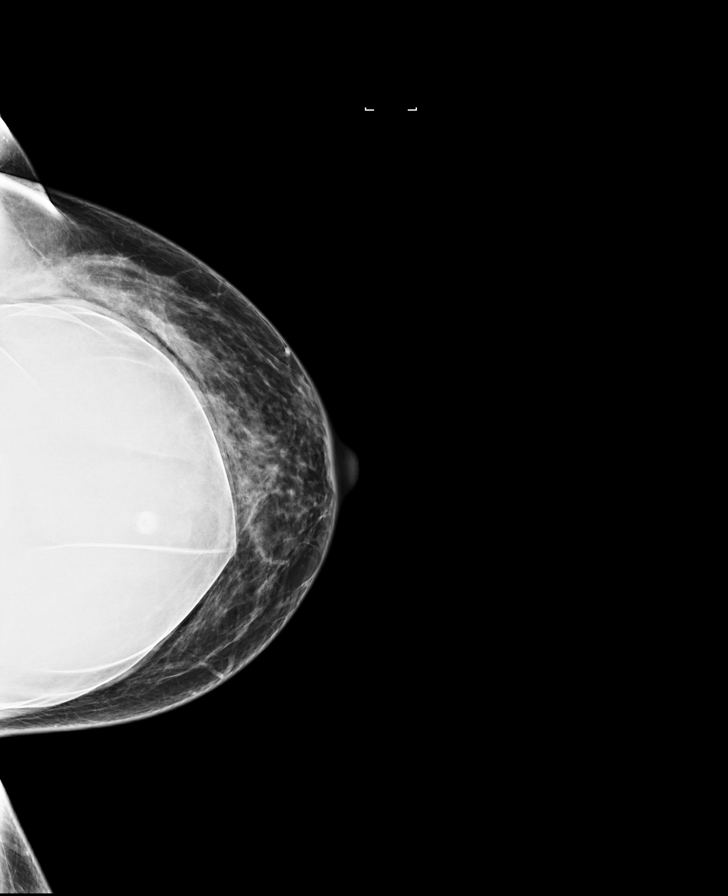

[R CC synth-2D]
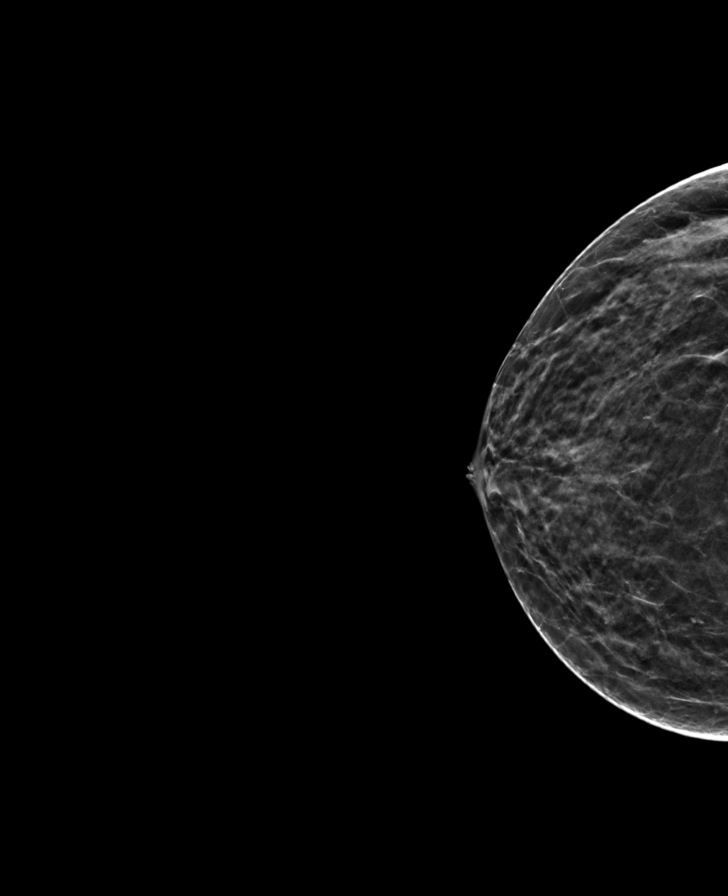

[L CC synth-2D]
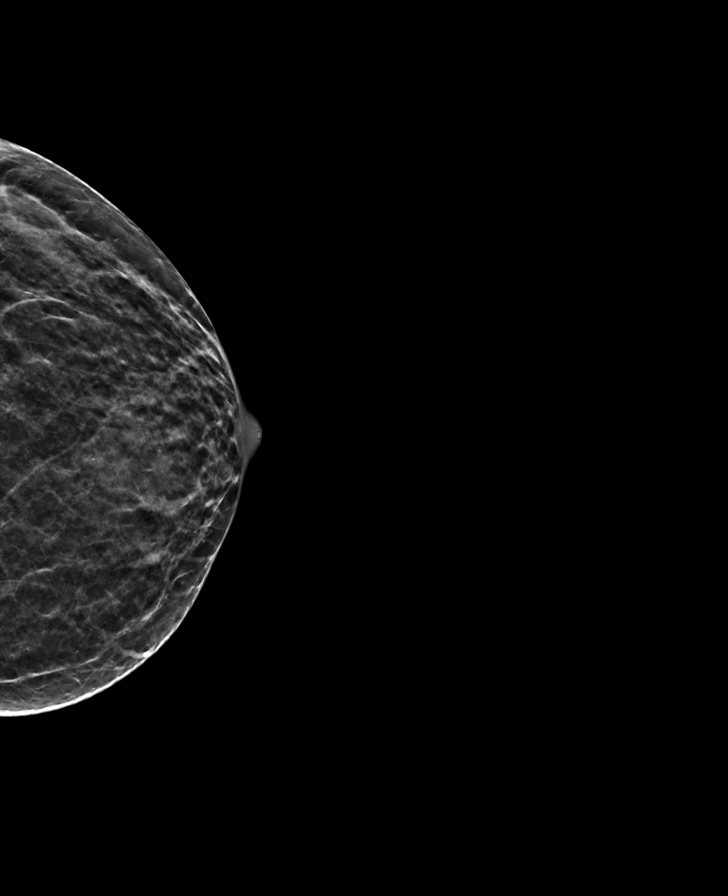

[R MLO synth-2D]
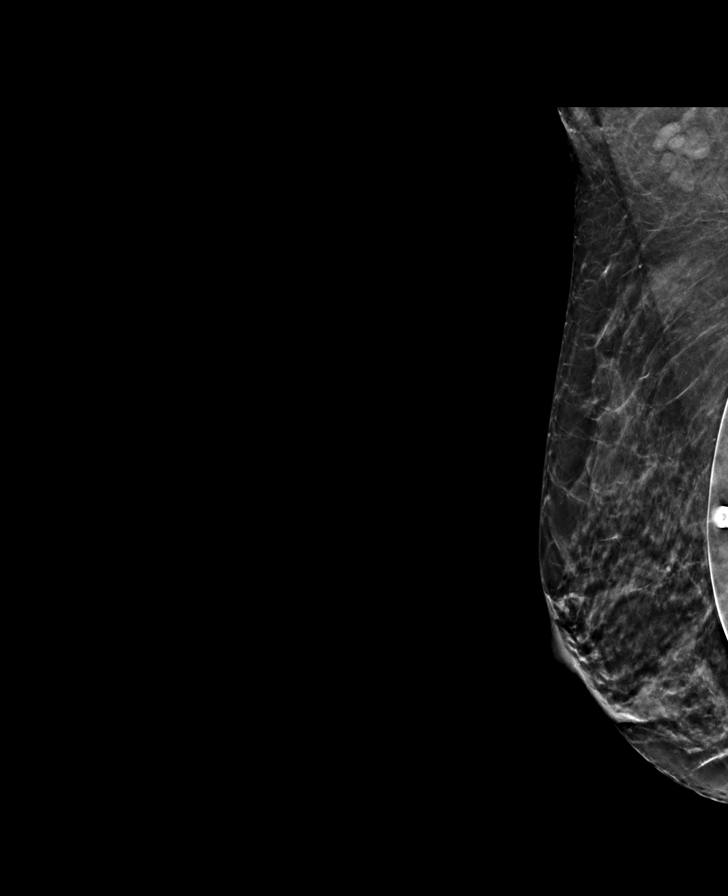

[L MLO synth-2D]
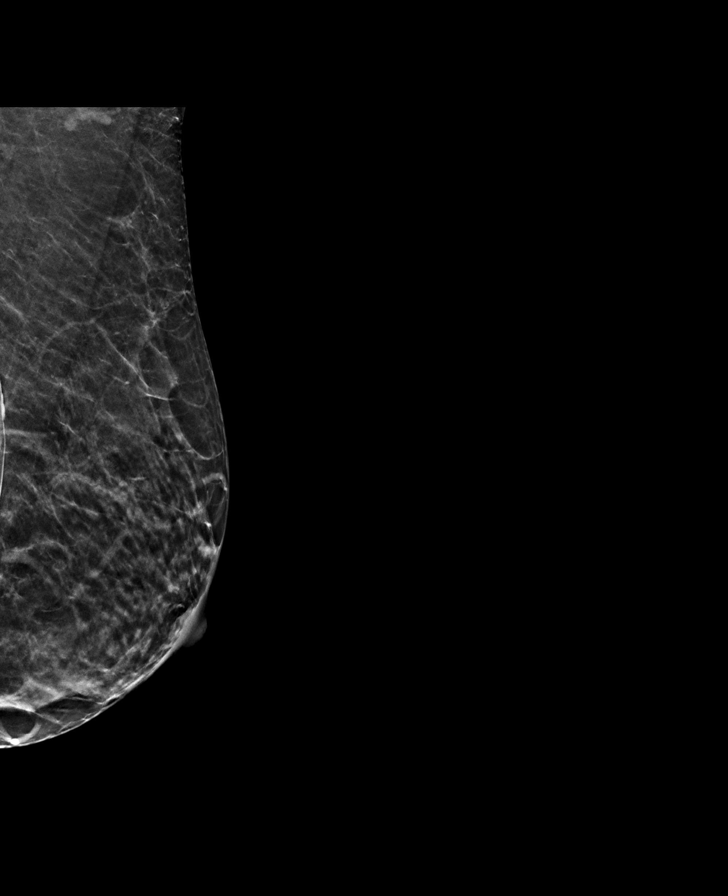

[8 of 28 positions shown; findings below may reference images not displayed]

ACR Breast Density Category c: The breast tissue is heterogeneously
dense, which may obscure small masses.
FINDINGS: The patient has retropectoral implants. There are no findings
suspicious for malignancy.
IMPRESSION: No mammographic evidence of malignancy. A result letter of this
screening mammogram will be mailed directly to the patient.

RECOMMENDATION:
Screening mammogram in one year. (Code:LT-E-7TH)

BI-RADS CATEGORY  1:  Negative.

## 2022-09-06 ENCOUNTER — Encounter: Payer: Self-pay | Admitting: Family Medicine

## 2022-09-06 ENCOUNTER — Other Ambulatory Visit: Payer: Self-pay

## 2022-09-06 DIAGNOSIS — B379 Candidiasis, unspecified: Secondary | ICD-10-CM

## 2022-09-06 MED ORDER — FLUCONAZOLE 150 MG PO TABS
ORAL_TABLET | ORAL | 1 refills | Status: DC
Start: 2022-09-06 — End: 2023-08-15

## 2022-12-05 ENCOUNTER — Other Ambulatory Visit: Payer: Self-pay

## 2022-12-05 DIAGNOSIS — B379 Candidiasis, unspecified: Secondary | ICD-10-CM

## 2022-12-05 MED ORDER — FLUCONAZOLE 150 MG PO TABS: 150.0000 mg | ORAL_TABLET | ORAL | 3 refills | Status: DC

## 2022-12-05 NOTE — Progress Notes (Signed)
Recurrent vaginitis.

## 2022-12-07 ENCOUNTER — Encounter: Payer: Self-pay | Admitting: Family Medicine

## 2023-03-14 ENCOUNTER — Telehealth (HOSPITAL_BASED_OUTPATIENT_CLINIC_OR_DEPARTMENT_OTHER): Payer: Self-pay

## 2023-03-14 IMAGING — US US TRANSVAGINAL NON-OB
1 series · 13 of 25 positions shown · non-contrast
Comparison: Prior ultrasound from 09/10/2020

CLINICAL DATA: Initial evaluation for abnormal uterine bleeding.
Premenopausal per history.

EXAM:
TRANSABDOMINAL AND TRANSVAGINAL ULTRASOUND OF PELVIS
TECHNIQUE: Both transabdominal and transvaginal ultrasound examinations of the
pelvis were performed. Transabdominal technique was performed for
global imaging of the pelvis including uterus, ovaries, adnexal
regions, and pelvic cul-de-sac. It was necessary to proceed with
endovaginal exam following the transabdominal exam to visualize the
endometrium ovaries.

[Series 1: us transvaginal non-ob · 13 of 76 slices shown]
[im 1/76]
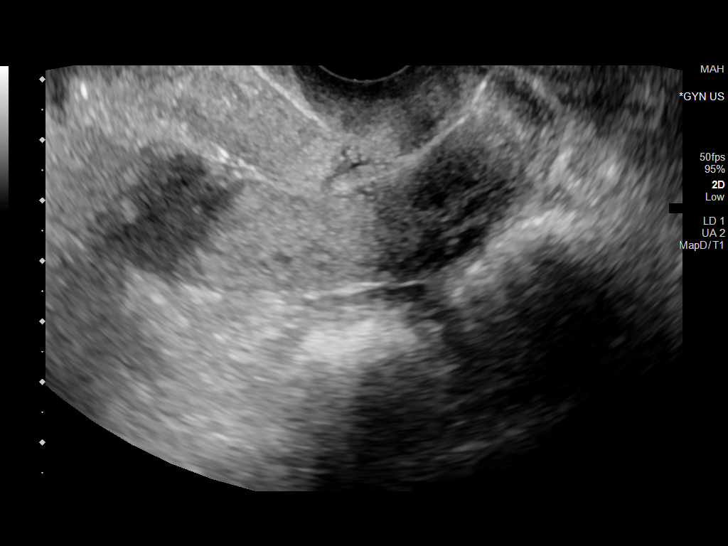
[im 7/76]
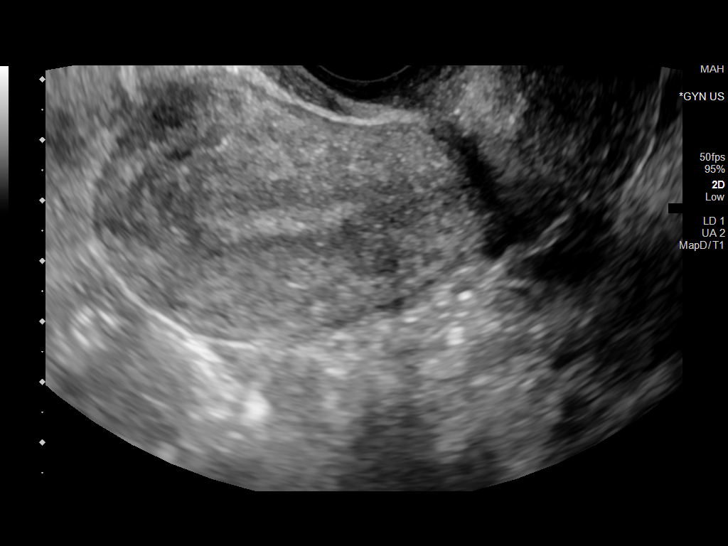
[im 13/76]
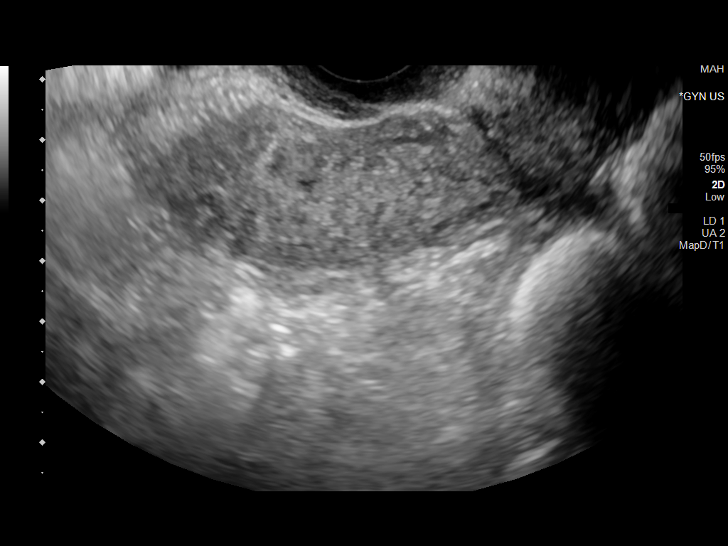
[im 19/76]
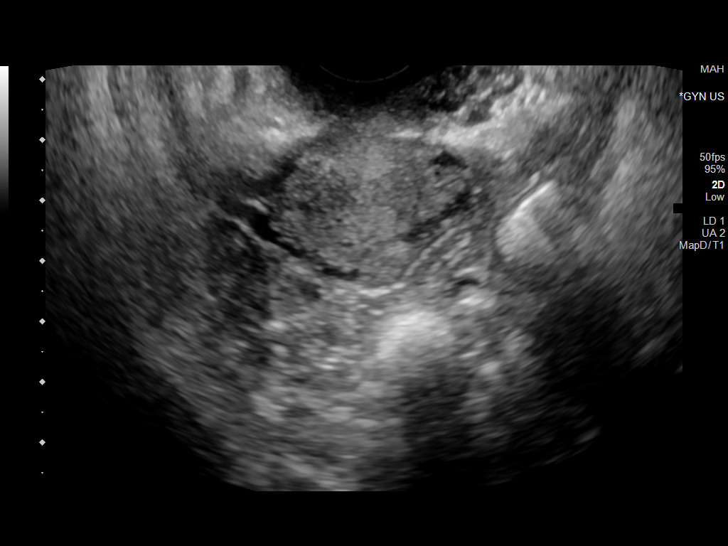
[im 26/76]
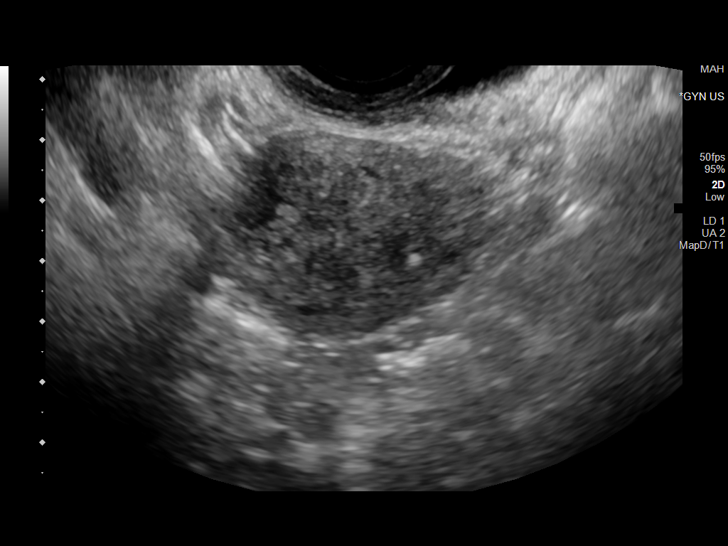
[im 32/76]
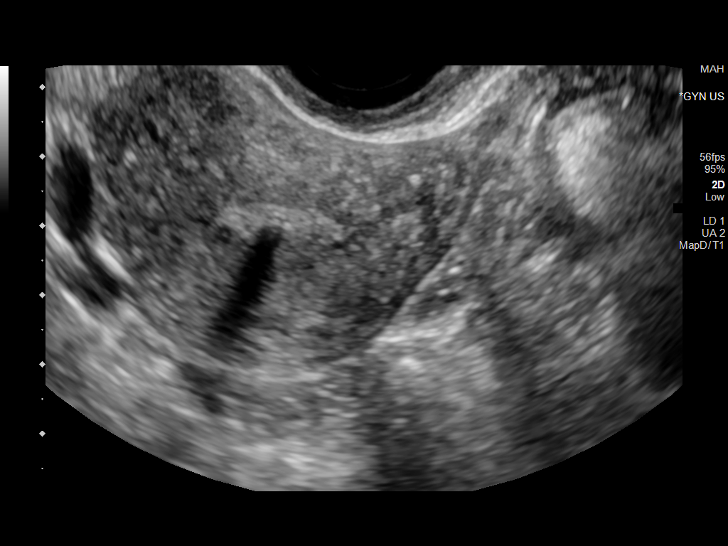
[im 38/76]
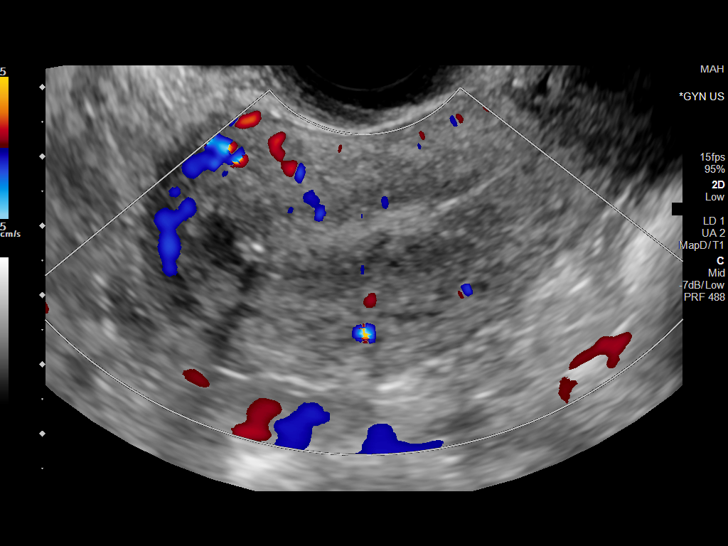
[im 44/76]
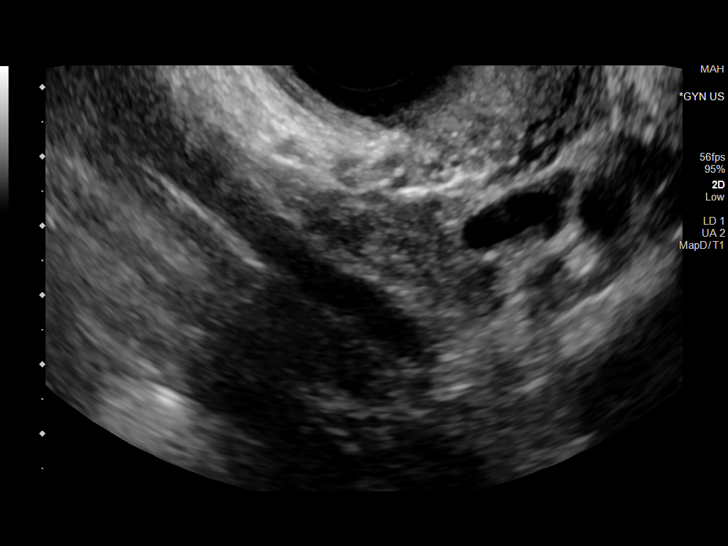
[im 51/76]
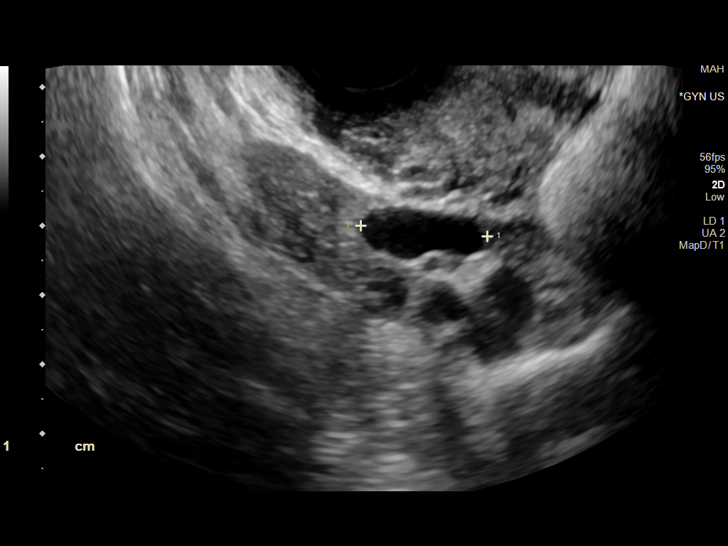
[im 57/76]
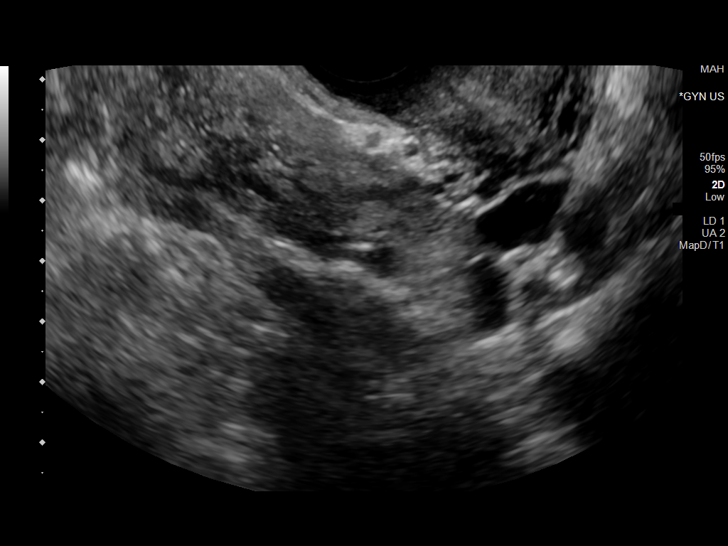
[im 63/76]
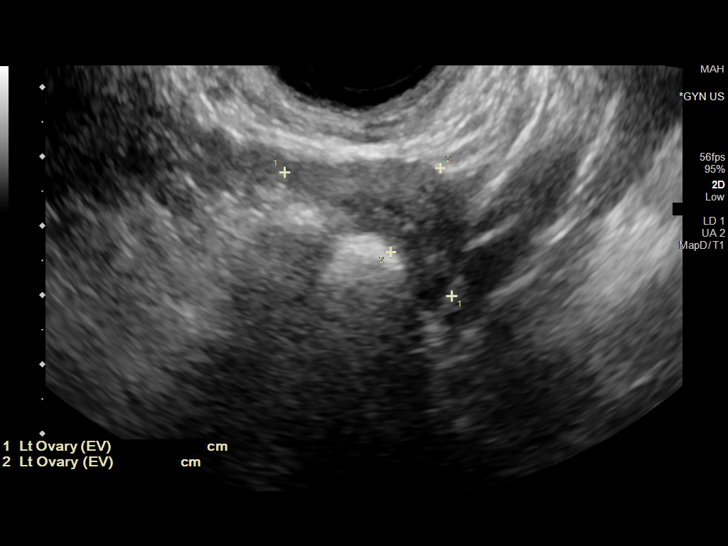
[im 69/76]
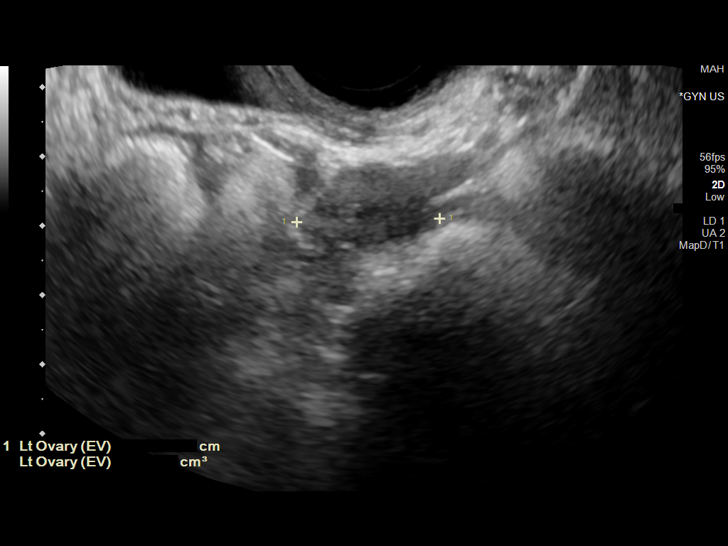
[im 76/76]
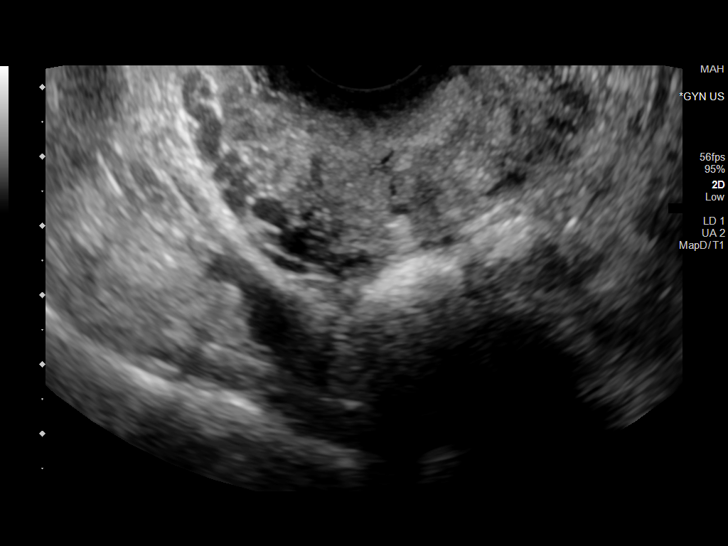

[13 of 25 positions shown; findings below may reference images not displayed]

FINDINGS: Uterus

Measurements: 9.6 x 4.7 x 5.7 cm = volume: 134.8 mL. 7 x 5 x 6 mm
echogenic lesion within the mid posterior uterine body likely
reflects a small calcified intramural fibroid. Similar 1.3 x 1.5 x
1.6 cm calcified intramural fibroid at the right posterior uterine
body. 1.2 x 1.0 x 1.0 cm subserosal fibroid at the right anterior
uterine fundus. 1.2 x 1.0 x 1.0 cm intramural fibroid at the left
mid uterine body.

Endometrium

Thickness: 8.1 mm. No focal abnormality visualized. IUD
appropriately positioned within the endometrial cavity at the level
of the mid uterine body.

Right ovary

Measurements: 2.7 x 1.4 x 1.8 cm = volume: 3.7 mL. 1.5 x 0.8 x
cm simple cyst seen within the right adnexa, likely an exophytic
follicular cyst versus paraovarian cyst. Additional similar 0.7 x
1.2 x 1.0 cm simple cyst could also reflect an exophytic follicular
cyst versus simple paraovarian cyst. No other concerning adnexal
mass.

Left ovary

Measurements: 3.0 x 1.4 x 2.1 cm = volume: 4.6 mL. Normal
appearance/no adnexal mass.

Other findings

No abnormal free fluid.
IMPRESSION: 1. Endometrial stripe measures 8.1 mm in thickness. If bleeding
remains unresponsive to hormonal or medical therapy, sonohysterogram
should be considered for focal lesion work-up. (Ref: Radiological
Reasoning: Algorithmic Workup of Abnormal Vaginal Bleeding with
Endovaginal Sonography and Sonohysterography. AJR 4330; 191:S68-73).
2. IUD in appropriate position within the endometrial cavity.
3. Underlying fibroid uterus as detailed above.
4. Two adjacent simple right adnexal cysts measuring up to 1.5 cm as
above, likely exophytic follicular cyst versus simple paraovarian
cyst. These are almost certainly benign given size, with no
follow-up imaging recommended. Note: This recommendation does not
apply to premenarchal patients or to those with increased risk
(genetic, family history, elevated tumor markers or other high-risk
factors) of ovarian cancer. Reference: Radiology [DATE];

## 2023-07-25 ENCOUNTER — Other Ambulatory Visit (HOSPITAL_BASED_OUTPATIENT_CLINIC_OR_DEPARTMENT_OTHER): Payer: Self-pay | Admitting: Obstetrics and Gynecology

## 2023-07-25 DIAGNOSIS — Z1231 Encounter for screening mammogram for malignant neoplasm of breast: Secondary | ICD-10-CM

## 2023-07-26 ENCOUNTER — Ambulatory Visit (HOSPITAL_BASED_OUTPATIENT_CLINIC_OR_DEPARTMENT_OTHER)
Admission: RE | Admit: 2023-07-26 | Discharge: 2023-07-26 | Disposition: A | Discharge status: Home / Self Care | Source: Ambulatory Visit | Attending: Obstetrics and Gynecology | Admitting: Obstetrics and Gynecology

## 2023-07-26 ENCOUNTER — Encounter (HOSPITAL_BASED_OUTPATIENT_CLINIC_OR_DEPARTMENT_OTHER): Payer: Self-pay

## 2023-07-26 DIAGNOSIS — Z1231 Encounter for screening mammogram for malignant neoplasm of breast: Secondary | ICD-10-CM | POA: Diagnosis present

## 2023-07-27 ENCOUNTER — Ambulatory Visit: Payer: Self-pay | Admitting: Obstetrics and Gynecology

## 2023-08-08 NOTE — Progress Notes (Signed)
 HISTORY OF PRESENT ILLNESS: This is a 53 year old patient who I previously seen for other areas who today presents for ongoing right elbow pain.  She states that her symptoms have been present for more than a year.  No discrete injury.  Pain is predominantly to the lateral side.  She has had various injections with cortisone, and has tried an offloading strap in the past, worked with occupational therapy, and lifted with the palm up.  She has tried medications, without relief.  More recently the patient was referred for an MRI and is here to review her results.  Current pain scale is 7 out of 10.  MRI right elbow reviewed from 07/10/2023.  There is mild tendinosis of the common extensor tendon origin consistent with lateral epicondylitis without significant tear.  Mild thickening of the flexor pronator tendon consistent with medial epicondylitis.  Low-grade acute on chronic sprain of the ulnar collateral ligament.  Small joint effusion.   PHYSICAL EXAMINATION: General:  Well-developed, well-nourished.  Psychiatric:  Alert, oriented and appropriate. Affect and mood are normal.   Respiratory:  Breathing is nonlabored. Lymph: no obvious lympadenopathy Skin: no erythema, edema or ecchymosis identified.  Neurovascular: Sensation intact to light touch. Palpable pulses.  Brisk capillary refill.  Digits are warm and well-perfused. Musculoskeletal:   Shoulder and elbow range of motion within normal limits.  Patient has discomfort on the lateral elbow with terminal extension positions.  Normal strength elbow flexion/extension and pronation/supination.  She has pain with wrist extension but no pain with resisted wrist flexion.  Negative moving valgus test, Negative Tinel's. tenderness to palpation at lateral humeral epicondyle.  No pain over the medial epicondyle.  Sensation intact to light touch in axillary, median, ulnar, and radial distributions.  Brisk capillary refill. 2+ radial pulse.  On examination of the  on contralateral arm, there is normal range of motion throughout, no tenderness to palpation, well perfused.  5/5 strength, no skin lesions, no instability.  Sensation within normal.  Compartments soft.   RADIOGRAPHS/ DIAGNOSTIC STUDIES: AP and lateral views of the elbow reviewed from previous study, reveals no fracture, subluxation, or dislocation. The joint spaces are well preserved. No soft tissue calcifications. Normal alignment. No pathologic effusions.    MRI right elbow with findings as summarized above  ASSESSMENT: Lateral epicondylitis of right elbow   PLAN: Patient has longstanding lateral epicondylitis which has been unresponsive to previous conservative treatment including cortisone injections, bracing, physical therapy, and medication.  We discussed her MRI findings.  She does not have any clinical symptoms of medial epicondylitis or medial elbow instability.  We discussed her options for treatment including PRP injection of the lateral epicondylitis, or surgical debridement.  At this time patient is content to continue with conservative management but was fitted for a wrist brace as an alternative strategy.  If she would like to move forward with PRP we can set this up.  She understands this is cash only.  Likewise we can arrange for surgery if she elects to proceed with that option.   This note has been created with voice recognition software.  While this note has been edited for accuracy, the software periodically misinterprets speech resulting in errors that might not have been caught in editing.  In the event you find an unusual error in this record, please notify us  at [phone] to resolve the same.

## 2023-08-13 NOTE — Progress Notes (Unsigned)
   ANNUAL EXAM Patient name: Regina Schaefer MRN [id number]  Date of birth: [birth year] Chief Complaint:   No chief complaint on file.  History of Present Illness:   Regina Schaefer is a 53 y.o. [phone] female being seen today for a routine annual exam.   Current concerns: ***  Current birth control: IUD which also helps with her vaginal bleeding. She takes OCPs continuously to help with BTB. She takes Junel 1.5/30 daily.   No LMP recorded. (Menstrual status: IUD).   Last MXR: 07/27/23 Last Pap/Pap History:  2017, 2020, 2021: Pap wnl/HPV neg 09/2020: ASCUS/HPV neg.   Review of Systems:   Pertinent items are noted in HPI Denies any headaches, blurred vision, fatigue, shortness of breath, chest pain, abdominal pain, abnormal vaginal discharge/itching/odor/irritation, problems with periods, bowel movements, urination, or intercourse unless otherwise stated above. *** Pertinent History Reviewed:  Reviewed past medical,surgical, social and family history.  Reviewed problem list, medications and allergies. Physical Assessment:  There were no vitals filed for this visit.There is no height or weight on file to calculate BMI.   Physical Examination:  General appearance - well appearing, and in no distress Mental status - alert, oriented to person, place, and time Psych:  She has a normal mood and affect Skin - warm and dry, normal color, no suspicious lesions noted Chest - effort normal Heart - normal rate  Breasts - breasts appear normal, no suspicious masses, no skin or nipple changes or axillary nodes Abdomen - soft, nontender, nondistended, no masses or organomegaly Pelvic -  {Blank single:19197::Performed and:,declines,not indicated} VULVA: {Blank single:19197::Not examined,normal appearing vulva with no masses, tenderness or lesions,***} VAGINA: {Blank single:19197::Not examined,normal appearing vagina with normal color and discharge, no lesions,***} CERVIX: {Blank  single:19197::Not examined,normal appearing cervix without discharge or lesions, no CMT.,***} UTERUS: {Blank single:19197::Not examined,uterus is felt to be normal size, shape, consistency and nontender,***} ADNEXA: {Blank single:19197::Not examined,No adnexal masses or tenderness noted,***} Extremities:  No swelling or varicosities noted  Chaperone present for exam  No results found for this or any previous visit (from the past 24 hours).  Assessment & Plan:  Diagnoses and all orders for this visit:  Encounter for annual routine gynecological examination  Intrauterine contraceptive device threads lost, initial encounter  Abnormal uterine bleeding (AUB)   - Cervical cancer screening: Discussed guidelines. Pap with HPV done - Breast Health: Encouraged self breast awareness/SBE. Discussed limits of clinical breast exam for detecting breast cancer. Discussed importance of annual MXR. MXR is up to date: 07/2023 - Climacteric/Sexual health: Reviewed typical and atypical symptoms of menopause/peri-menopause. Discussed PMB and to call if any amount of spotting.  - Colonoscopy: {Blank single:19197::Per PCP,up to date,declines} - F/U 12 months and prn   IUD confirmed in place by US  last March.    Check FSH. Suspect she is postmenopausal. Liletta  placed November 2022.     No orders of the defined types were placed in this encounter.   Meds: No orders of the defined types were placed in this encounter.   Follow-up: No follow-ups on file.  Vina Solian, MD 08/13/2023 12:48 PM

## 2023-08-15 ENCOUNTER — Other Ambulatory Visit (HOSPITAL_COMMUNITY)
Admission: RE | Admit: 2023-08-15 | Discharge: 2023-08-15 | Disposition: A | Discharge status: Home / Self Care | Source: Ambulatory Visit | Attending: Obstetrics and Gynecology | Admitting: Obstetrics and Gynecology

## 2023-08-15 ENCOUNTER — Ambulatory Visit: Admitting: Obstetrics and Gynecology

## 2023-08-15 ENCOUNTER — Encounter: Payer: Self-pay | Admitting: Obstetrics and Gynecology

## 2023-08-15 VITALS — BP 98/66 | HR 79 | Ht 64.0 in | Wt 161.0 lb

## 2023-08-15 DIAGNOSIS — N898 Other specified noninflammatory disorders of vagina: Secondary | ICD-10-CM | POA: Insufficient documentation

## 2023-08-15 DIAGNOSIS — Z01419 Encounter for gynecological examination (general) (routine) without abnormal findings: Secondary | ICD-10-CM | POA: Insufficient documentation

## 2023-08-15 DIAGNOSIS — Z30431 Encounter for routine checking of intrauterine contraceptive device: Secondary | ICD-10-CM

## 2023-08-15 DIAGNOSIS — T8332XA Displacement of intrauterine contraceptive device, initial encounter: Secondary | ICD-10-CM

## 2023-08-15 DIAGNOSIS — Z01411 Encounter for gynecological examination (general) (routine) with abnormal findings: Secondary | ICD-10-CM | POA: Insufficient documentation

## 2023-08-15 DIAGNOSIS — Z1151 Encounter for screening for human papillomavirus (HPV): Secondary | ICD-10-CM | POA: Diagnosis not present

## 2023-08-15 DIAGNOSIS — Z1331 Encounter for screening for depression: Secondary | ICD-10-CM | POA: Diagnosis not present

## 2023-08-15 DIAGNOSIS — N939 Abnormal uterine and vaginal bleeding, unspecified: Secondary | ICD-10-CM | POA: Diagnosis not present

## 2023-08-16 ENCOUNTER — Ambulatory Visit: Payer: Self-pay | Admitting: Obstetrics and Gynecology

## 2023-08-16 LAB — CERVICOVAGINAL ANCILLARY ONLY
Bacterial Vaginitis (gardnerella): NEGATIVE
Candida Glabrata: NEGATIVE
Candida Vaginitis: NEGATIVE
Comment: NEGATIVE
Comment: NEGATIVE
Comment: NEGATIVE

## 2023-08-22 LAB — CYTOLOGY - PAP
Comment: NEGATIVE
Diagnosis: NEGATIVE
Diagnosis: REACTIVE
High risk HPV: NEGATIVE

## 2023-08-28 LAB — FOLLICLE STIMULATING HORMONE: FSH: 86.2 m[IU]/mL

## 2023-11-15 ENCOUNTER — Telehealth: Payer: Self-pay | Admitting: *Deleted

## 2023-11-15 DIAGNOSIS — M722 Plantar fascial fibromatosis: Secondary | ICD-10-CM

## 2023-11-15 NOTE — Telephone Encounter (Signed)
 TY

## 2023-11-15 NOTE — Telephone Encounter (Signed)
 I left a message for the patient asking her to come in 20 minutes prior to her appointment time because we need to send her to imaging for xrays of her feet.

## 2023-11-16 ENCOUNTER — Ambulatory Visit (INDEPENDENT_AMBULATORY_CARE_PROVIDER_SITE_OTHER)

## 2023-11-16 ENCOUNTER — Ambulatory Visit: Admitting: Podiatry

## 2023-11-16 ENCOUNTER — Encounter: Payer: Self-pay | Admitting: Podiatry

## 2023-11-16 DIAGNOSIS — M722 Plantar fascial fibromatosis: Secondary | ICD-10-CM

## 2023-11-16 MED ORDER — MELOXICAM 15 MG PO TABS: 15.0000 mg | ORAL_TABLET | Freq: Every day | ORAL | 0 refills | Status: AC

## 2023-11-16 NOTE — Progress Notes (Signed)
  Subjective:  Patient ID: Regina Schaefer, female    DOB: [birth year],   MRN: [id number]  Chief Complaint  Patient presents with   Foot Pain    Bilateral Pain on bottom of feet.  Also, having pain in R foot 1st met medial and 3rd toe pain. X 2 weeks.  Not diabetic no anti coag.     53 y.o. female presents for concern of bilateral foot pain that has been ongoing for 2 weeks. Relates pain along the bottom of her foot and in her heel that she notices most when getting up after rest. Relates very difficult on steps too. She also relates some soreness around her great toe on the right and third toe.    . Denies any other pedal complaints. Denies n/v/f/c.   Past Medical History:  Diagnosis Date   Hyperlipidemia     Objective:  Physical Exam: Vascular: DP/PT pulses 2/4 bilateral. CFT <3 seconds. Normal hair growth on digits. No edema.  Skin. No lacerations or abrasions bilateral feet.  Musculoskeletal: MMT 5/5 bilateral lower extremities in DF, PF, Inversion and Eversion. Deceased ROM in DF of ankle joint. Tender to the medial calcaneal tubercle bilaterally . No pain with achilles, PT or arch. No pain with calcaneal squeeze.  Neurological: Sensation intact to light touch.   Assessment:   1. Plantar fasciitis, bilateral      Plan:  Patient was evaluated and treated and all questions answered. Discussed plantar fasciitis with patient.  X-rays reviewed and discussed with patient. No acute fractures or dislocations noted. Mild spurring noted at inferior calcaneus.  Discussed treatment options including, ice, NSAIDS, supportive shoes, bracing, and stretching. Stretching exercises provided to be done on a daily basis.   Prescription for meloxicam provided and sent to pharmacy. PF braces dipsensed.  Follow-up 6 weeks or sooner if any problems arise. In the meantime, encouraged to call the office with any questions, concerns, change in symptoms.     Asberry Failing, DPM

## 2023-11-16 NOTE — Patient Instructions (Signed)

## 2023-12-28 ENCOUNTER — Ambulatory Visit: Admitting: Podiatry

## 2024-01-07 ENCOUNTER — Encounter: Payer: Self-pay | Admitting: Obstetrics and Gynecology

## 2024-01-08 ENCOUNTER — Other Ambulatory Visit: Payer: Self-pay

## 2024-01-09 ENCOUNTER — Other Ambulatory Visit: Payer: Self-pay

## 2024-01-09 MED ORDER — TRIAMCINOLONE ACETONIDE 0.5 % EX CREA: TOPICAL_CREAM | CUTANEOUS | 6 refills | Status: AC

## 2024-01-18 ENCOUNTER — Ambulatory Visit: Admitting: Podiatry

## 2024-02-05 ENCOUNTER — Other Ambulatory Visit (HOSPITAL_COMMUNITY)
Admission: RE | Admit: 2024-02-05 | Discharge: 2024-02-05 | Disposition: A | Discharge status: Home / Self Care | Source: Ambulatory Visit | Attending: Obstetrics and Gynecology | Admitting: Obstetrics and Gynecology

## 2024-02-05 ENCOUNTER — Ambulatory Visit

## 2024-02-05 VITALS — BP 121/78 | HR 94 | Ht 64.0 in | Wt 169.0 lb

## 2024-02-05 DIAGNOSIS — R319 Hematuria, unspecified: Secondary | ICD-10-CM

## 2024-02-05 DIAGNOSIS — Z113 Encounter for screening for infections with a predominantly sexual mode of transmission: Secondary | ICD-10-CM

## 2024-02-05 DIAGNOSIS — N898 Other specified noninflammatory disorders of vagina: Secondary | ICD-10-CM

## 2024-02-05 LAB — POCT URINALYSIS DIPSTICK
Bilirubin, UA: NEGATIVE
Glucose, UA: NEGATIVE
Ketones, UA: NEGATIVE
Leukocytes, UA: NEGATIVE
Nitrite, UA: NEGATIVE
Protein, UA: NEGATIVE
Spec Grav, UA: 1.02
Urobilinogen, UA: 0.2 U/dL
pH, UA: 7

## 2024-02-05 NOTE — Progress Notes (Signed)
 SUBJECTIVE:  53 y.o. female complains of yellowish vaginal discharge for 1 week(s). Denies abnormal vaginal bleeding or significant pelvic pain or fever. No UTI symptoms. Denies history of known exposure to STD.  No LMP recorded. (Menstrual status: IUD).  OBJECTIVE:  She appears well, afebrile. Urine dipstick: positive for trace blood  ASSESSMENT:  Vaginal Discharge  Vaginal Odor   PLAN:  GC, chlamydia, trichomonas, BVAG, CVAG probe sent to lab. Urine Culture sent. Treatment: To be determined once lab results are received ROV prn if symptoms persist or worsen.  Silvano LELON Piano, RN

## 2024-02-06 LAB — CERVICOVAGINAL ANCILLARY ONLY
Comment: NEGATIVE
Comment: NEGATIVE
Comment: NEGATIVE
Comment: NEGATIVE
Comment: NEGATIVE
Comment: NORMAL

## 2024-02-07 LAB — URINE CULTURE

## 2024-02-11 ENCOUNTER — Encounter: Payer: Self-pay | Admitting: Podiatry

## 2024-02-11 ENCOUNTER — Ambulatory Visit: Admitting: Podiatry

## 2024-02-11 DIAGNOSIS — M722 Plantar fascial fibromatosis: Secondary | ICD-10-CM

## 2024-02-11 NOTE — Progress Notes (Signed)
"  °  Subjective:  Patient ID: Regina Schaefer, female    DOB: 07-Aug-1970,   MRN: [id number]  Chief Complaint  Patient presents with   Plantar Fasciitis    They're doing much better.    54 y.o. female presents for follow-up of bilateral plantar fasciitis. Relates doing much better. She has been stretching wearing the brace and taking meloxicam . Relates 90% better. Does relate some trouble with the top of her foot and sensitivity in that area.   . Denies any other pedal complaints. Denies n/v/f/c.   Past Medical History:  Diagnosis Date   Hyperlipidemia     Objective:  Physical Exam: Vascular: DP/PT pulses 2/4 bilateral. CFT <3 seconds. Normal hair growth on digits. No edema.  Skin. No lacerations or abrasions bilateral feet.  Musculoskeletal: MMT 5/5 bilateral lower extremities in DF, PF, Inversion and Eversion. Deceased ROM in DF of ankle joint. Mildly tender to the medial calcaneal tubercle bilaterally . No pain with achilles, PT or arch. No pain with calcaneal squeeze.  Neurological: Sensation intact to light touch.   Assessment:   1. Plantar fasciitis, bilateral      Plan:  Patient was evaluated and treated and all questions answered. Discussed plantar fasciitis with patient.  X-rays reviewed and discussed with patient. No acute fractures or dislocations noted. Mild spurring noted at inferior calcaneus.  Discussed treatment options including, ice, NSAIDS, supportive shoes, bracing, and stretching. Continue stretching and anti-inflammatoires as needed.  Continue brace Discussed pain on the top of the foot could be related to back vs arthritis of great toe. Discussed trying voltaren  and getting back checked out.  Follow-up as needed    Asberry Failing, DPM    "

## 2024-02-12 ENCOUNTER — Ambulatory Visit

## 2024-02-12 ENCOUNTER — Other Ambulatory Visit: Payer: Self-pay

## 2024-02-12 ENCOUNTER — Other Ambulatory Visit (HOSPITAL_COMMUNITY)
Admission: RE | Admit: 2024-02-12 | Discharge: 2024-02-12 | Disposition: A | Discharge status: Home / Self Care | Source: Ambulatory Visit | Attending: Obstetrics and Gynecology | Admitting: Obstetrics and Gynecology

## 2024-02-12 DIAGNOSIS — Z113 Encounter for screening for infections with a predominantly sexual mode of transmission: Secondary | ICD-10-CM | POA: Diagnosis present

## 2024-02-12 DIAGNOSIS — N898 Other specified noninflammatory disorders of vagina: Secondary | ICD-10-CM

## 2024-02-13 ENCOUNTER — Ambulatory Visit: Payer: Self-pay | Admitting: Obstetrics and Gynecology

## 2024-02-13 LAB — CERVICOVAGINAL ANCILLARY ONLY
Bacterial Vaginitis (gardnerella): NEGATIVE
Candida Glabrata: NEGATIVE
Candida Vaginitis: NEGATIVE
Chlamydia: NEGATIVE
Comment: NEGATIVE
Comment: NEGATIVE
Comment: NEGATIVE
Comment: NEGATIVE
Comment: NEGATIVE
Comment: NORMAL
Neisseria Gonorrhea: NEGATIVE
Trichomonas: NEGATIVE
